# Patient Record
Sex: Female | Born: 1955
Health system: Southern US, Community
[De-identification: ages and names within clinical notes are randomized; demographics above are authoritative.]

## PROBLEM LIST (undated history)

## (undated) DIAGNOSIS — E039 Hypothyroidism, unspecified: Secondary | ICD-10-CM

## (undated) DIAGNOSIS — M65979 Unspecified synovitis and tenosynovitis, unspecified ankle and foot: Secondary | ICD-10-CM

## (undated) DIAGNOSIS — C50919 Malignant neoplasm of unspecified site of unspecified female breast: Secondary | ICD-10-CM

## (undated) DIAGNOSIS — L0390 Cellulitis, unspecified: Secondary | ICD-10-CM

## (undated) DIAGNOSIS — M722 Plantar fascial fibromatosis: Secondary | ICD-10-CM

## (undated) DIAGNOSIS — C50111 Malignant neoplasm of central portion of right female breast: Secondary | ICD-10-CM

## (undated) DIAGNOSIS — M659 Synovitis and tenosynovitis, unspecified: Secondary | ICD-10-CM

## (undated) DIAGNOSIS — T82598A Other mechanical complication of other cardiac and vascular devices and implants, initial encounter: Secondary | ICD-10-CM

## (undated) HISTORY — DX: Hypothyroidism, unspecified: E03.9

## (undated) HISTORY — DX: Synovitis and tenosynovitis, unspecified: M65.9

## (undated) HISTORY — PX: MASTECTOMY: SHX3

## (undated) HISTORY — DX: Unspecified synovitis and tenosynovitis, unspecified ankle and foot: M65.979

## (undated) HISTORY — DX: Other mechanical complication of other cardiac and vascular devices and implants, initial encounter: T82.598A

## (undated) HISTORY — PX: ABDOMINAL HYSTERECTOMY: SHX81

## (undated) HISTORY — PX: OTHER SURGICAL HISTORY: SHX169

## (undated) HISTORY — DX: Malignant neoplasm of unspecified site of unspecified female breast: C50.919

## (undated) HISTORY — DX: Plantar fascial fibromatosis: M72.2

## (undated) HISTORY — DX: Cellulitis, unspecified: L03.90

## (undated) HISTORY — DX: Malignant neoplasm of central portion of right female breast: C50.111

---

## 2004-03-01 ENCOUNTER — Ambulatory Visit: Payer: Self-pay | Admitting: Oncology

## 2005-01-09 ENCOUNTER — Ambulatory Visit: Payer: Self-pay | Admitting: Oncology

## 2005-03-05 ENCOUNTER — Ambulatory Visit: Payer: Self-pay | Admitting: Oncology

## 2006-04-01 ENCOUNTER — Ambulatory Visit: Payer: Self-pay | Admitting: Oncology

## 2006-04-23 ENCOUNTER — Encounter: Admission: RE | Admit: 2006-04-23 | Discharge: 2006-04-23 | Payer: Self-pay | Admitting: Oncology

## 2006-06-01 ENCOUNTER — Encounter (INDEPENDENT_AMBULATORY_CARE_PROVIDER_SITE_OTHER): Payer: Self-pay | Admitting: *Deleted

## 2006-06-01 ENCOUNTER — Encounter: Admission: RE | Admit: 2006-06-01 | Discharge: 2006-06-01 | Payer: Self-pay | Admitting: Oncology

## 2006-06-01 ENCOUNTER — Encounter (INDEPENDENT_AMBULATORY_CARE_PROVIDER_SITE_OTHER): Payer: Self-pay | Admitting: Diagnostic Radiology

## 2006-11-02 ENCOUNTER — Ambulatory Visit: Payer: Self-pay | Admitting: Oncology

## 2010-06-11 HISTORY — PX: PORT-A-CATH REMOVAL: SHX5289

## 2010-07-26 ENCOUNTER — Encounter: Payer: Self-pay | Admitting: Cardiothoracic Surgery

## 2010-07-26 ENCOUNTER — Ambulatory Visit
Admission: RE | Admit: 2010-07-26 | Discharge: 2010-07-26 | Disposition: A | Discharge status: Home / Self Care | Payer: PRIVATE HEALTH INSURANCE | Source: Ambulatory Visit | Attending: Surgery | Admitting: Surgery

## 2010-07-26 ENCOUNTER — Encounter (INDEPENDENT_AMBULATORY_CARE_PROVIDER_SITE_OTHER): Payer: PRIVATE HEALTH INSURANCE | Admitting: Cardiothoracic Surgery

## 2010-07-26 ENCOUNTER — Other Ambulatory Visit: Payer: Self-pay | Admitting: Surgery

## 2010-07-26 DIAGNOSIS — T82598A Other mechanical complication of other cardiac and vascular devices and implants, initial encounter: Secondary | ICD-10-CM

## 2010-07-27 NOTE — Consult Note (Signed)
NEW PATIENT CONSULTATION  [name], [name] DOB:  Oct 09, 1955                                        July 26, 2010 CHART #:  [id number]  REASON FOR CONSULTATION:  Retained Port-A-Cath segment and right atrium.  PRESENT ILLNESS:  [name] is a 55 year old Caucasian female nonsmoker with a history of bilateral breast cancer who had a Port-A-Cath placed 3- 4 years ago for chemotherapy, and it was removed by Dr. Earney Navy on Jun 21, 2010, at [facility].  The entire catheter did not come out, and she was sent directly from the operating room to Interventional Radiology where Dr. Bonnielee Haff attempted a femoral vein approach to snare the catheter and removed it, which was unsuccessful.  This was done at [facility] and fluoroscopy time was just under 14 minutes.  A chest x-ray taken today shows the Port-A-Cath catheter's fragment to be in the right atrium just proximal to the tricuspid valve.  There have been no symptoms of palpitation or shortness of breath.  The patient is not anticoagulated and is in a sinus rhythm now.  Because of the retained catheter segment, it was unable to be removed by IR.  A surgical evaluation was requested.  PAST MEDICAL HISTORY: 1. History of bilateral invasive ductal adenocarcinoma of the breast,     status post radiation therapy to the chest 12 years ago and status     post right mastectomy approximately 4 years ago.  She also had a     left lumpectomy in the past for cancer. 2. Hypothyroidism. 3. Status post hysterectomy.  HOME MEDICATIONS:  Fosamax, Synthroid, and Arimidex.  ALLERGIES:  PENICILLIN and LATEX.  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  She is a nonsmoker and drinks occasionally.  FAMILY HISTORY:  Noncontributory.  REVIEW OF SYSTEMS:  GENERAL:  No systemic problems of fever or weight loss or chest pain.  No previous thoracic surgery other than the  Port-A- Cath and mastectomy and lumpectomy.  No thoracic injuries.  CARDIAC: Review is negative for history of angina, CHF, cardiac murmur, or arrhythmia.  GI:  Review is negative for hepatitis or jaundice or blood per rectum.  ENDOCRINE:  Review is negative for diabetes.  VASCULAR: Review is negative for DVT.  Her mother did have pulmonary embolus from DVT 40 years ago.  NEUROLOGIC:  Review is negative for stroke or seizure.  HEMATOLOGIC:  Review is significant for breast cancer, which is probably in remission.  Her nodal status from her most recent mastectomy was negative.  PHYSICAL EXAMINATION:  VITAL SIGNS:  Blood pressure 130/80, pulse 60 and regular, respirations 18, saturation 99%.  She weighs in 183 and she is 5 feet 6 inches. GENERAL:  She is an alert and pleasant 55 year old Caucasian female. HEENT:  Normocephalic. NECK:  Without JVD or mass or adenopathy. LUNGS:  Breath sounds are clear and equal.  She has tattoo marks on her anterior chest extending from the right lateral clavicular line to the sternum with a mastectomy well-healed scar. CARDIAC:  Rhythm is regular without S3 gallop or murmur. ABDOMEN:  Soft and nontender without mass. EXTREMITIES:  No clubbing, cyanosis, or edema.  Peripheral pulses were intact.  There is no peripheral edema. NEUROLOGIC:  Normal.  LABORATORY DATA:  Recent chest x-ray shows the retained segment of  a Port-A-Cath in her right atrium extending up to the tricuspid valve.  IMPRESSION/RECOMMENDATIONS:  I doubt the catheter would be at risk for migration in the lungs since aggressive attempt at interventional radiology.  Removal was unsuccessful in pulling out the catheter.  I am concerned about long-term effects of thromboembolic complications from thrombus forming on the catheter and then migrating into the lungs. Over the long-term, I think she would be better off of the catheter removed.  However, before sternotomy, an open heart surgery  and cardiopulmonary bypass, I would recommend another attempted interventional radiology here in [place].  We will contact the IR staff here to consider that as the initial best option.  We will contact the patient regarding this in the near future sign.  Kerin Perna, M.D. Electronically Signed  PV/MEDQ  D:  07/26/2010  T:  07/27/2010  Job:  [id number]  cc:   [employer]

## 2010-08-15 ENCOUNTER — Other Ambulatory Visit: Payer: Self-pay | Admitting: Cardiothoracic Surgery

## 2010-08-15 DIAGNOSIS — T82598A Other mechanical complication of other cardiac and vascular devices and implants, initial encounter: Secondary | ICD-10-CM

## 2010-08-16 ENCOUNTER — Other Ambulatory Visit: Payer: Self-pay | Admitting: Cardiothoracic Surgery

## 2010-08-16 ENCOUNTER — Ambulatory Visit
Admission: RE | Admit: 2010-08-16 | Discharge: 2010-08-16 | Disposition: A | Discharge status: Home / Self Care | Payer: PRIVATE HEALTH INSURANCE | Source: Ambulatory Visit | Attending: Cardiothoracic Surgery | Admitting: Cardiothoracic Surgery

## 2010-08-16 ENCOUNTER — Ambulatory Visit (INDEPENDENT_AMBULATORY_CARE_PROVIDER_SITE_OTHER): Payer: PRIVATE HEALTH INSURANCE | Admitting: Cardiothoracic Surgery

## 2010-08-16 DIAGNOSIS — T82598A Other mechanical complication of other cardiac and vascular devices and implants, initial encounter: Secondary | ICD-10-CM

## 2010-08-16 DIAGNOSIS — IMO0002 Reserved for concepts with insufficient information to code with codable children: Secondary | ICD-10-CM

## 2010-08-17 NOTE — Assessment & Plan Note (Signed)
OFFICE VISIT  Stacy Park, Stacy Park DOB:  10-26-1955                                        August 16, 2010 CHART #:  [id number]  CURRENT PROBLEMS: 1. Retained 6-cm segment of Port-A-Cath in the right ventricle noted     following Port-A-Cath removal, May 2012. 2. History of breast cancer, status post chemoradiation. 3. Jehovah's [employer]es.  PRESENT ILLNESS:  The patient is a very nice 55 year old female who returns for further followup discussion of a retained segment of a Port- A-Cath in her right ventricle.  This has been asymptomatic.  She has no arrhythmias or symptoms.  An attempt by Interventional Radiology to extract the catheter in May of this year on site at [facility] was unsuccessful.  She is back to work full-time.  She was seen approximately 2 weeks ago for this problem and I recommended a second attempt by Interventional Radiology in the [place] facility to try to extract the catheter segment and after discussing this with Interventional Radiology hear, no further appointment or evaluation was scheduled.  The patient remains asymptomatic.  I have discussed this retained segment of cath with Dr. Fredia Sorrow and the Interventional Radiology at [facility] and he has agreed to arrange for the patient to present here for attempted extraction via a femoral vein puncture on August 20, 2010.  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  I told the patient if the second attempt of extraction percutaneously unsuccessful, she will be probably need a sternotomy and cardiopulmonary bypass in opening of the right atrium.  PHYSICAL EXAMINATION:  Today, her blood pressure 120/80, pulse is 60 and regular, saturation on room air 99%.  The left infraclavicular Port-A- Cath incision is well healed.  Breath sounds are clear and the cardiac rhythm is regular without murmur or rub.  PLAN:  The patient will have a second attempt at  Interventional Radiology extraction of the Port-A-Cath segment.  If this is not successful, she will return for discussion of a possible sternotomy and cardiopulmonary bypass.  In that case, we would need to build her hemoglobin up over 12 grams as she would not accept the blood transfusion therapy as needed.  Kerin Perna, M.D. Electronically Signed  PV/MEDQ  D:  08/16/2010  T:  08/17/2010  Job:  [id number]  cc:   Dellia Beckwith, M.D.

## 2010-08-20 ENCOUNTER — Ambulatory Visit (HOSPITAL_COMMUNITY)
Admission: RE | Admit: 2010-08-20 | Discharge: 2010-08-20 | Disposition: A | Discharge status: Home / Self Care | Payer: PRIVATE HEALTH INSURANCE | Source: Ambulatory Visit | Attending: Cardiothoracic Surgery | Admitting: Cardiothoracic Surgery

## 2010-08-20 ENCOUNTER — Other Ambulatory Visit: Payer: Self-pay | Admitting: Cardiothoracic Surgery

## 2010-08-20 DIAGNOSIS — Z853 Personal history of malignant neoplasm of breast: Secondary | ICD-10-CM | POA: Insufficient documentation

## 2010-08-20 DIAGNOSIS — Y9210 Unspecified residential institution as the place of occurrence of the external cause: Secondary | ICD-10-CM | POA: Insufficient documentation

## 2010-08-20 DIAGNOSIS — T81507A Unspecified complication of foreign body accidentally left in body following removal of catheter or packing, initial encounter: Secondary | ICD-10-CM | POA: Insufficient documentation

## 2010-08-20 DIAGNOSIS — IMO0002 Reserved for concepts with insufficient information to code with codable children: Secondary | ICD-10-CM

## 2010-08-20 DIAGNOSIS — T81509A Unspecified complication of foreign body accidentally left in body following unspecified procedure, initial encounter: Secondary | ICD-10-CM | POA: Insufficient documentation

## 2010-08-20 MED ORDER — IODIXANOL 320 MG/ML IV SOLN
100.0000 mL | Freq: Once | INTRAVENOUS | Status: AC | PRN
  Administered 2010-08-20: 20 mL via INTRAVENOUS

## 2010-09-30 DIAGNOSIS — M722 Plantar fascial fibromatosis: Secondary | ICD-10-CM

## 2010-09-30 DIAGNOSIS — M659 Synovitis and tenosynovitis, unspecified: Secondary | ICD-10-CM

## 2010-09-30 DIAGNOSIS — L0390 Cellulitis, unspecified: Secondary | ICD-10-CM

## 2010-09-30 DIAGNOSIS — E039 Hypothyroidism, unspecified: Secondary | ICD-10-CM

## 2010-09-30 DIAGNOSIS — T82598A Other mechanical complication of other cardiac and vascular devices and implants, initial encounter: Secondary | ICD-10-CM

## 2010-09-30 DIAGNOSIS — C50919 Malignant neoplasm of unspecified site of unspecified female breast: Secondary | ICD-10-CM

## 2010-10-02 ENCOUNTER — Encounter: Payer: PRIVATE HEALTH INSURANCE | Admitting: Cardiothoracic Surgery

## 2015-09-07 DIAGNOSIS — J40 Bronchitis, not specified as acute or chronic: Secondary | ICD-10-CM | POA: Diagnosis not present

## 2015-12-14 DIAGNOSIS — Z1389 Encounter for screening for other disorder: Secondary | ICD-10-CM | POA: Diagnosis not present

## 2015-12-14 DIAGNOSIS — Z0000 Encounter for general adult medical examination without abnormal findings: Secondary | ICD-10-CM | POA: Diagnosis not present

## 2016-06-27 DIAGNOSIS — Z6831 Body mass index (BMI) 31.0-31.9, adult: Secondary | ICD-10-CM | POA: Diagnosis not present

## 2016-06-27 DIAGNOSIS — L03213 Periorbital cellulitis: Secondary | ICD-10-CM | POA: Diagnosis not present

## 2016-07-10 DIAGNOSIS — L739 Follicular disorder, unspecified: Secondary | ICD-10-CM | POA: Diagnosis not present

## 2016-07-18 DIAGNOSIS — R21 Rash and other nonspecific skin eruption: Secondary | ICD-10-CM | POA: Diagnosis not present

## 2016-07-18 DIAGNOSIS — W57XXXA Bitten or stung by nonvenomous insect and other nonvenomous arthropods, initial encounter: Secondary | ICD-10-CM | POA: Diagnosis not present

## 2016-08-22 DIAGNOSIS — Z1231 Encounter for screening mammogram for malignant neoplasm of breast: Secondary | ICD-10-CM | POA: Diagnosis not present

## 2016-08-22 DIAGNOSIS — Z853 Personal history of malignant neoplasm of breast: Secondary | ICD-10-CM | POA: Diagnosis not present

## 2016-08-27 DIAGNOSIS — Z853 Personal history of malignant neoplasm of breast: Secondary | ICD-10-CM | POA: Diagnosis not present

## 2016-08-27 DIAGNOSIS — C50111 Malignant neoplasm of central portion of right female breast: Secondary | ICD-10-CM | POA: Diagnosis not present

## 2016-09-14 DIAGNOSIS — H00016 Hordeolum externum left eye, unspecified eyelid: Secondary | ICD-10-CM | POA: Diagnosis not present

## 2016-09-24 DIAGNOSIS — H109 Unspecified conjunctivitis: Secondary | ICD-10-CM | POA: Diagnosis not present

## 2016-09-24 DIAGNOSIS — Z6831 Body mass index (BMI) 31.0-31.9, adult: Secondary | ICD-10-CM | POA: Diagnosis not present

## 2016-10-27 DIAGNOSIS — J40 Bronchitis, not specified as acute or chronic: Secondary | ICD-10-CM | POA: Diagnosis not present

## 2016-10-27 DIAGNOSIS — Z6831 Body mass index (BMI) 31.0-31.9, adult: Secondary | ICD-10-CM | POA: Diagnosis not present

## 2016-11-05 DIAGNOSIS — Z6831 Body mass index (BMI) 31.0-31.9, adult: Secondary | ICD-10-CM | POA: Diagnosis not present

## 2016-11-05 DIAGNOSIS — J40 Bronchitis, not specified as acute or chronic: Secondary | ICD-10-CM | POA: Diagnosis not present

## 2016-11-13 DIAGNOSIS — Z6831 Body mass index (BMI) 31.0-31.9, adult: Secondary | ICD-10-CM | POA: Diagnosis not present

## 2016-11-13 DIAGNOSIS — R05 Cough: Secondary | ICD-10-CM | POA: Diagnosis not present

## 2016-11-13 DIAGNOSIS — J40 Bronchitis, not specified as acute or chronic: Secondary | ICD-10-CM | POA: Diagnosis not present

## 2016-12-25 DIAGNOSIS — Z23 Encounter for immunization: Secondary | ICD-10-CM | POA: Diagnosis not present

## 2016-12-25 DIAGNOSIS — Z1331 Encounter for screening for depression: Secondary | ICD-10-CM | POA: Diagnosis not present

## 2016-12-25 DIAGNOSIS — Z0000 Encounter for general adult medical examination without abnormal findings: Secondary | ICD-10-CM | POA: Diagnosis not present

## 2016-12-25 DIAGNOSIS — Z6832 Body mass index (BMI) 32.0-32.9, adult: Secondary | ICD-10-CM | POA: Diagnosis not present

## 2016-12-25 DIAGNOSIS — E039 Hypothyroidism, unspecified: Secondary | ICD-10-CM | POA: Diagnosis not present

## 2016-12-26 DIAGNOSIS — Z79899 Other long term (current) drug therapy: Secondary | ICD-10-CM | POA: Diagnosis not present

## 2016-12-26 DIAGNOSIS — E039 Hypothyroidism, unspecified: Secondary | ICD-10-CM | POA: Diagnosis not present

## 2017-01-08 DIAGNOSIS — N179 Acute kidney failure, unspecified: Secondary | ICD-10-CM | POA: Diagnosis not present

## 2017-04-07 DIAGNOSIS — Z6832 Body mass index (BMI) 32.0-32.9, adult: Secondary | ICD-10-CM | POA: Diagnosis not present

## 2017-04-07 DIAGNOSIS — J0120 Acute ethmoidal sinusitis, unspecified: Secondary | ICD-10-CM | POA: Diagnosis not present

## 2017-06-22 DIAGNOSIS — J329 Chronic sinusitis, unspecified: Secondary | ICD-10-CM | POA: Diagnosis not present

## 2017-06-22 DIAGNOSIS — Z6833 Body mass index (BMI) 33.0-33.9, adult: Secondary | ICD-10-CM | POA: Diagnosis not present

## 2017-08-24 DIAGNOSIS — Z1231 Encounter for screening mammogram for malignant neoplasm of breast: Secondary | ICD-10-CM | POA: Diagnosis not present

## 2017-08-27 DIAGNOSIS — Z79811 Long term (current) use of aromatase inhibitors: Secondary | ICD-10-CM | POA: Diagnosis not present

## 2017-08-27 DIAGNOSIS — Z853 Personal history of malignant neoplasm of breast: Secondary | ICD-10-CM | POA: Diagnosis not present

## 2017-09-03 DIAGNOSIS — Z6832 Body mass index (BMI) 32.0-32.9, adult: Secondary | ICD-10-CM | POA: Diagnosis not present

## 2017-09-03 DIAGNOSIS — F4322 Adjustment disorder with anxiety: Secondary | ICD-10-CM | POA: Diagnosis not present

## 2017-09-03 DIAGNOSIS — Z1339 Encounter for screening examination for other mental health and behavioral disorders: Secondary | ICD-10-CM | POA: Diagnosis not present

## 2017-10-06 DIAGNOSIS — J329 Chronic sinusitis, unspecified: Secondary | ICD-10-CM | POA: Diagnosis not present

## 2017-10-06 DIAGNOSIS — Z6832 Body mass index (BMI) 32.0-32.9, adult: Secondary | ICD-10-CM | POA: Diagnosis not present

## 2017-10-06 DIAGNOSIS — J40 Bronchitis, not specified as acute or chronic: Secondary | ICD-10-CM | POA: Diagnosis not present

## 2018-01-01 DIAGNOSIS — Z13220 Encounter for screening for lipoid disorders: Secondary | ICD-10-CM | POA: Diagnosis not present

## 2018-01-01 DIAGNOSIS — Z6832 Body mass index (BMI) 32.0-32.9, adult: Secondary | ICD-10-CM | POA: Diagnosis not present

## 2018-01-01 DIAGNOSIS — Z23 Encounter for immunization: Secondary | ICD-10-CM | POA: Diagnosis not present

## 2018-01-01 DIAGNOSIS — Z0000 Encounter for general adult medical examination without abnormal findings: Secondary | ICD-10-CM | POA: Diagnosis not present

## 2018-01-01 DIAGNOSIS — Z1331 Encounter for screening for depression: Secondary | ICD-10-CM | POA: Diagnosis not present

## 2018-01-26 DIAGNOSIS — R221 Localized swelling, mass and lump, neck: Secondary | ICD-10-CM | POA: Diagnosis not present

## 2018-01-26 DIAGNOSIS — Z6832 Body mass index (BMI) 32.0-32.9, adult: Secondary | ICD-10-CM | POA: Diagnosis not present

## 2018-02-01 DIAGNOSIS — E041 Nontoxic single thyroid nodule: Secondary | ICD-10-CM | POA: Diagnosis not present

## 2018-02-01 DIAGNOSIS — R221 Localized swelling, mass and lump, neck: Secondary | ICD-10-CM | POA: Diagnosis not present

## 2018-02-08 DIAGNOSIS — R0989 Other specified symptoms and signs involving the circulatory and respiratory systems: Secondary | ICD-10-CM | POA: Diagnosis not present

## 2018-02-08 DIAGNOSIS — Z853 Personal history of malignant neoplasm of breast: Secondary | ICD-10-CM | POA: Diagnosis not present

## 2018-02-08 DIAGNOSIS — R221 Localized swelling, mass and lump, neck: Secondary | ICD-10-CM | POA: Diagnosis not present

## 2018-02-08 DIAGNOSIS — E041 Nontoxic single thyroid nodule: Secondary | ICD-10-CM | POA: Diagnosis not present

## 2018-03-02 DIAGNOSIS — J029 Acute pharyngitis, unspecified: Secondary | ICD-10-CM | POA: Diagnosis not present

## 2018-03-02 DIAGNOSIS — E042 Nontoxic multinodular goiter: Secondary | ICD-10-CM | POA: Diagnosis not present

## 2018-03-02 DIAGNOSIS — Z853 Personal history of malignant neoplasm of breast: Secondary | ICD-10-CM | POA: Diagnosis not present

## 2018-03-02 DIAGNOSIS — E039 Hypothyroidism, unspecified: Secondary | ICD-10-CM | POA: Diagnosis not present

## 2018-03-02 DIAGNOSIS — R221 Localized swelling, mass and lump, neck: Secondary | ICD-10-CM | POA: Diagnosis not present

## 2018-03-02 DIAGNOSIS — Z79811 Long term (current) use of aromatase inhibitors: Secondary | ICD-10-CM | POA: Diagnosis not present

## 2018-03-03 DIAGNOSIS — C50912 Malignant neoplasm of unspecified site of left female breast: Secondary | ICD-10-CM | POA: Diagnosis not present

## 2018-03-03 DIAGNOSIS — C50111 Malignant neoplasm of central portion of right female breast: Secondary | ICD-10-CM | POA: Diagnosis not present

## 2018-03-03 DIAGNOSIS — C50911 Malignant neoplasm of unspecified site of right female breast: Secondary | ICD-10-CM | POA: Diagnosis not present

## 2018-03-03 DIAGNOSIS — Z853 Personal history of malignant neoplasm of breast: Secondary | ICD-10-CM | POA: Diagnosis not present

## 2018-03-03 DIAGNOSIS — R221 Localized swelling, mass and lump, neck: Secondary | ICD-10-CM | POA: Diagnosis not present

## 2018-03-03 DIAGNOSIS — R591 Generalized enlarged lymph nodes: Secondary | ICD-10-CM | POA: Diagnosis not present

## 2018-03-08 DIAGNOSIS — R9389 Abnormal findings on diagnostic imaging of other specified body structures: Secondary | ICD-10-CM | POA: Diagnosis not present

## 2018-03-08 DIAGNOSIS — R918 Other nonspecific abnormal finding of lung field: Secondary | ICD-10-CM | POA: Diagnosis not present

## 2018-03-08 DIAGNOSIS — C50111 Malignant neoplasm of central portion of right female breast: Secondary | ICD-10-CM | POA: Diagnosis not present

## 2018-03-15 ENCOUNTER — Encounter: Payer: Self-pay | Admitting: Oncology

## 2018-03-15 DIAGNOSIS — C50111 Malignant neoplasm of central portion of right female breast: Secondary | ICD-10-CM | POA: Diagnosis not present

## 2018-03-15 DIAGNOSIS — C770 Secondary and unspecified malignant neoplasm of lymph nodes of head, face and neck: Secondary | ICD-10-CM | POA: Diagnosis not present

## 2018-03-15 DIAGNOSIS — C801 Malignant (primary) neoplasm, unspecified: Secondary | ICD-10-CM | POA: Diagnosis not present

## 2018-03-15 DIAGNOSIS — R590 Localized enlarged lymph nodes: Secondary | ICD-10-CM | POA: Diagnosis not present

## 2018-03-15 DIAGNOSIS — R221 Localized swelling, mass and lump, neck: Secondary | ICD-10-CM | POA: Diagnosis not present

## 2018-03-18 DIAGNOSIS — Z8042 Family history of malignant neoplasm of prostate: Secondary | ICD-10-CM | POA: Diagnosis not present

## 2018-03-18 DIAGNOSIS — C7951 Secondary malignant neoplasm of bone: Secondary | ICD-10-CM | POA: Diagnosis not present

## 2018-03-18 DIAGNOSIS — C779 Secondary and unspecified malignant neoplasm of lymph node, unspecified: Secondary | ICD-10-CM | POA: Diagnosis not present

## 2018-03-18 DIAGNOSIS — Z803 Family history of malignant neoplasm of breast: Secondary | ICD-10-CM | POA: Diagnosis not present

## 2018-03-18 DIAGNOSIS — C778 Secondary and unspecified malignant neoplasm of lymph nodes of multiple regions: Secondary | ICD-10-CM | POA: Diagnosis not present

## 2018-03-18 DIAGNOSIS — Z853 Personal history of malignant neoplasm of breast: Secondary | ICD-10-CM | POA: Diagnosis not present

## 2018-03-18 DIAGNOSIS — C50919 Malignant neoplasm of unspecified site of unspecified female breast: Secondary | ICD-10-CM | POA: Diagnosis not present

## 2018-03-18 DIAGNOSIS — C50111 Malignant neoplasm of central portion of right female breast: Secondary | ICD-10-CM | POA: Diagnosis not present

## 2018-03-22 DIAGNOSIS — M8589 Other specified disorders of bone density and structure, multiple sites: Secondary | ICD-10-CM | POA: Diagnosis not present

## 2018-03-22 DIAGNOSIS — C50111 Malignant neoplasm of central portion of right female breast: Secondary | ICD-10-CM | POA: Diagnosis not present

## 2018-03-22 DIAGNOSIS — Z13820 Encounter for screening for osteoporosis: Secondary | ICD-10-CM | POA: Diagnosis not present

## 2018-03-22 DIAGNOSIS — C50919 Malignant neoplasm of unspecified site of unspecified female breast: Secondary | ICD-10-CM | POA: Diagnosis not present

## 2018-03-22 DIAGNOSIS — C7951 Secondary malignant neoplasm of bone: Secondary | ICD-10-CM | POA: Diagnosis not present

## 2018-03-23 DIAGNOSIS — C7951 Secondary malignant neoplasm of bone: Secondary | ICD-10-CM | POA: Diagnosis not present

## 2018-03-23 DIAGNOSIS — C50111 Malignant neoplasm of central portion of right female breast: Secondary | ICD-10-CM | POA: Diagnosis not present

## 2018-03-31 DIAGNOSIS — C778 Secondary and unspecified malignant neoplasm of lymph nodes of multiple regions: Secondary | ICD-10-CM | POA: Diagnosis not present

## 2018-03-31 DIAGNOSIS — C50111 Malignant neoplasm of central portion of right female breast: Secondary | ICD-10-CM | POA: Diagnosis not present

## 2018-03-31 DIAGNOSIS — C7951 Secondary malignant neoplasm of bone: Secondary | ICD-10-CM | POA: Diagnosis not present

## 2018-04-14 DIAGNOSIS — C778 Secondary and unspecified malignant neoplasm of lymph nodes of multiple regions: Secondary | ICD-10-CM | POA: Diagnosis not present

## 2018-04-14 DIAGNOSIS — C50111 Malignant neoplasm of central portion of right female breast: Secondary | ICD-10-CM | POA: Diagnosis not present

## 2018-04-14 DIAGNOSIS — C7951 Secondary malignant neoplasm of bone: Secondary | ICD-10-CM | POA: Diagnosis not present

## 2018-04-28 DIAGNOSIS — C778 Secondary and unspecified malignant neoplasm of lymph nodes of multiple regions: Secondary | ICD-10-CM | POA: Diagnosis not present

## 2018-04-28 DIAGNOSIS — C779 Secondary and unspecified malignant neoplasm of lymph node, unspecified: Secondary | ICD-10-CM

## 2018-04-28 DIAGNOSIS — Z853 Personal history of malignant neoplasm of breast: Secondary | ICD-10-CM

## 2018-04-28 DIAGNOSIS — C7951 Secondary malignant neoplasm of bone: Secondary | ICD-10-CM | POA: Diagnosis not present

## 2018-04-28 DIAGNOSIS — Z515 Encounter for palliative care: Secondary | ICD-10-CM | POA: Diagnosis not present

## 2018-04-28 DIAGNOSIS — C50111 Malignant neoplasm of central portion of right female breast: Secondary | ICD-10-CM | POA: Diagnosis not present

## 2018-05-05 DIAGNOSIS — C771 Secondary and unspecified malignant neoplasm of intrathoracic lymph nodes: Secondary | ICD-10-CM | POA: Diagnosis not present

## 2018-05-05 DIAGNOSIS — C7951 Secondary malignant neoplasm of bone: Secondary | ICD-10-CM | POA: Diagnosis not present

## 2018-05-05 DIAGNOSIS — C50111 Malignant neoplasm of central portion of right female breast: Secondary | ICD-10-CM | POA: Diagnosis not present

## 2018-05-05 DIAGNOSIS — Z79811 Long term (current) use of aromatase inhibitors: Secondary | ICD-10-CM | POA: Diagnosis not present

## 2018-05-19 DIAGNOSIS — C50111 Malignant neoplasm of central portion of right female breast: Secondary | ICD-10-CM | POA: Diagnosis not present

## 2018-05-31 DIAGNOSIS — Z853 Personal history of malignant neoplasm of breast: Secondary | ICD-10-CM | POA: Diagnosis not present

## 2018-05-31 DIAGNOSIS — C779 Secondary and unspecified malignant neoplasm of lymph node, unspecified: Secondary | ICD-10-CM | POA: Diagnosis not present

## 2018-05-31 DIAGNOSIS — R109 Unspecified abdominal pain: Secondary | ICD-10-CM | POA: Diagnosis not present

## 2018-05-31 DIAGNOSIS — C7951 Secondary malignant neoplasm of bone: Secondary | ICD-10-CM | POA: Diagnosis not present

## 2018-05-31 DIAGNOSIS — C50111 Malignant neoplasm of central portion of right female breast: Secondary | ICD-10-CM | POA: Diagnosis not present

## 2018-05-31 DIAGNOSIS — Z79810 Long term (current) use of selective estrogen receptor modulators (SERMs): Secondary | ICD-10-CM | POA: Diagnosis not present

## 2018-05-31 DIAGNOSIS — R1084 Generalized abdominal pain: Secondary | ICD-10-CM | POA: Diagnosis not present

## 2018-06-03 DIAGNOSIS — R590 Localized enlarged lymph nodes: Secondary | ICD-10-CM | POA: Diagnosis not present

## 2018-06-03 DIAGNOSIS — Z853 Personal history of malignant neoplasm of breast: Secondary | ICD-10-CM | POA: Diagnosis not present

## 2018-06-03 DIAGNOSIS — C50111 Malignant neoplasm of central portion of right female breast: Secondary | ICD-10-CM | POA: Diagnosis not present

## 2018-06-03 DIAGNOSIS — R918 Other nonspecific abnormal finding of lung field: Secondary | ICD-10-CM | POA: Diagnosis not present

## 2018-06-03 DIAGNOSIS — R1084 Generalized abdominal pain: Secondary | ICD-10-CM | POA: Diagnosis not present

## 2018-06-03 DIAGNOSIS — R935 Abnormal findings on diagnostic imaging of other abdominal regions, including retroperitoneum: Secondary | ICD-10-CM | POA: Diagnosis not present

## 2018-06-03 DIAGNOSIS — K6389 Other specified diseases of intestine: Secondary | ICD-10-CM | POA: Diagnosis not present

## 2018-06-03 DIAGNOSIS — R110 Nausea: Secondary | ICD-10-CM | POA: Diagnosis not present

## 2018-06-04 DIAGNOSIS — C50111 Malignant neoplasm of central portion of right female breast: Secondary | ICD-10-CM | POA: Diagnosis not present

## 2018-06-09 DIAGNOSIS — C7951 Secondary malignant neoplasm of bone: Secondary | ICD-10-CM | POA: Diagnosis not present

## 2018-06-09 DIAGNOSIS — R748 Abnormal levels of other serum enzymes: Secondary | ICD-10-CM | POA: Diagnosis not present

## 2018-06-09 DIAGNOSIS — D708 Other neutropenia: Secondary | ICD-10-CM | POA: Diagnosis not present

## 2018-06-09 DIAGNOSIS — C50111 Malignant neoplasm of central portion of right female breast: Secondary | ICD-10-CM | POA: Diagnosis not present

## 2018-06-09 DIAGNOSIS — E876 Hypokalemia: Secondary | ICD-10-CM | POA: Diagnosis not present

## 2018-06-09 DIAGNOSIS — C779 Secondary and unspecified malignant neoplasm of lymph node, unspecified: Secondary | ICD-10-CM | POA: Diagnosis not present

## 2018-06-10 DIAGNOSIS — R1011 Right upper quadrant pain: Secondary | ICD-10-CM | POA: Diagnosis not present

## 2018-06-10 DIAGNOSIS — C50111 Malignant neoplasm of central portion of right female breast: Secondary | ICD-10-CM | POA: Diagnosis not present

## 2018-06-10 DIAGNOSIS — Z79899 Other long term (current) drug therapy: Secondary | ICD-10-CM | POA: Diagnosis not present

## 2018-06-16 DIAGNOSIS — C50111 Malignant neoplasm of central portion of right female breast: Secondary | ICD-10-CM | POA: Diagnosis not present

## 2018-06-16 DIAGNOSIS — E876 Hypokalemia: Secondary | ICD-10-CM | POA: Diagnosis not present

## 2018-06-21 DIAGNOSIS — K529 Noninfective gastroenteritis and colitis, unspecified: Secondary | ICD-10-CM | POA: Diagnosis not present

## 2018-06-30 DIAGNOSIS — C50111 Malignant neoplasm of central portion of right female breast: Secondary | ICD-10-CM | POA: Diagnosis not present

## 2018-06-30 DIAGNOSIS — D708 Other neutropenia: Secondary | ICD-10-CM | POA: Diagnosis not present

## 2018-06-30 DIAGNOSIS — C7951 Secondary malignant neoplasm of bone: Secondary | ICD-10-CM | POA: Diagnosis not present

## 2018-07-14 DIAGNOSIS — D708 Other neutropenia: Secondary | ICD-10-CM | POA: Diagnosis not present

## 2018-07-14 DIAGNOSIS — Z853 Personal history of malignant neoplasm of breast: Secondary | ICD-10-CM | POA: Diagnosis not present

## 2018-07-14 DIAGNOSIS — C779 Secondary and unspecified malignant neoplasm of lymph node, unspecified: Secondary | ICD-10-CM | POA: Diagnosis not present

## 2018-07-14 DIAGNOSIS — C7951 Secondary malignant neoplasm of bone: Secondary | ICD-10-CM | POA: Diagnosis not present

## 2018-07-14 DIAGNOSIS — C50111 Malignant neoplasm of central portion of right female breast: Secondary | ICD-10-CM | POA: Diagnosis not present

## 2018-07-14 DIAGNOSIS — C778 Secondary and unspecified malignant neoplasm of lymph nodes of multiple regions: Secondary | ICD-10-CM | POA: Diagnosis not present

## 2018-07-21 DIAGNOSIS — C778 Secondary and unspecified malignant neoplasm of lymph nodes of multiple regions: Secondary | ICD-10-CM | POA: Diagnosis not present

## 2018-07-21 DIAGNOSIS — C50111 Malignant neoplasm of central portion of right female breast: Secondary | ICD-10-CM | POA: Diagnosis not present

## 2018-07-21 DIAGNOSIS — C7951 Secondary malignant neoplasm of bone: Secondary | ICD-10-CM | POA: Diagnosis not present

## 2018-08-18 DIAGNOSIS — C778 Secondary and unspecified malignant neoplasm of lymph nodes of multiple regions: Secondary | ICD-10-CM | POA: Diagnosis not present

## 2018-08-18 DIAGNOSIS — C7951 Secondary malignant neoplasm of bone: Secondary | ICD-10-CM | POA: Diagnosis not present

## 2018-08-18 DIAGNOSIS — C50111 Malignant neoplasm of central portion of right female breast: Secondary | ICD-10-CM | POA: Diagnosis not present

## 2018-08-27 DIAGNOSIS — Z1231 Encounter for screening mammogram for malignant neoplasm of breast: Secondary | ICD-10-CM | POA: Diagnosis not present

## 2018-08-27 DIAGNOSIS — Z853 Personal history of malignant neoplasm of breast: Secondary | ICD-10-CM | POA: Diagnosis not present

## 2018-09-13 DIAGNOSIS — Z853 Personal history of malignant neoplasm of breast: Secondary | ICD-10-CM | POA: Diagnosis not present

## 2018-09-13 DIAGNOSIS — C7951 Secondary malignant neoplasm of bone: Secondary | ICD-10-CM | POA: Diagnosis not present

## 2018-09-13 DIAGNOSIS — C771 Secondary and unspecified malignant neoplasm of intrathoracic lymph nodes: Secondary | ICD-10-CM | POA: Diagnosis not present

## 2018-09-13 DIAGNOSIS — C50111 Malignant neoplasm of central portion of right female breast: Secondary | ICD-10-CM | POA: Diagnosis not present

## 2018-09-15 DIAGNOSIS — C778 Secondary and unspecified malignant neoplasm of lymph nodes of multiple regions: Secondary | ICD-10-CM | POA: Diagnosis not present

## 2018-09-15 DIAGNOSIS — D701 Agranulocytosis secondary to cancer chemotherapy: Secondary | ICD-10-CM | POA: Diagnosis not present

## 2018-09-15 DIAGNOSIS — C7951 Secondary malignant neoplasm of bone: Secondary | ICD-10-CM | POA: Diagnosis not present

## 2018-09-15 DIAGNOSIS — Z515 Encounter for palliative care: Secondary | ICD-10-CM | POA: Diagnosis not present

## 2018-09-15 DIAGNOSIS — C50111 Malignant neoplasm of central portion of right female breast: Secondary | ICD-10-CM | POA: Diagnosis not present

## 2018-09-15 DIAGNOSIS — R0989 Other specified symptoms and signs involving the circulatory and respiratory systems: Secondary | ICD-10-CM | POA: Diagnosis not present

## 2018-09-28 DIAGNOSIS — E876 Hypokalemia: Secondary | ICD-10-CM | POA: Diagnosis not present

## 2018-09-28 DIAGNOSIS — C7951 Secondary malignant neoplasm of bone: Secondary | ICD-10-CM | POA: Diagnosis not present

## 2018-09-28 DIAGNOSIS — D72819 Decreased white blood cell count, unspecified: Secondary | ICD-10-CM | POA: Diagnosis not present

## 2018-09-28 DIAGNOSIS — C778 Secondary and unspecified malignant neoplasm of lymph nodes of multiple regions: Secondary | ICD-10-CM | POA: Diagnosis not present

## 2018-09-28 DIAGNOSIS — C50111 Malignant neoplasm of central portion of right female breast: Secondary | ICD-10-CM | POA: Diagnosis not present

## 2018-10-13 DIAGNOSIS — C778 Secondary and unspecified malignant neoplasm of lymph nodes of multiple regions: Secondary | ICD-10-CM | POA: Diagnosis not present

## 2018-10-13 DIAGNOSIS — C50111 Malignant neoplasm of central portion of right female breast: Secondary | ICD-10-CM | POA: Diagnosis not present

## 2018-10-13 DIAGNOSIS — C7951 Secondary malignant neoplasm of bone: Secondary | ICD-10-CM | POA: Diagnosis not present

## 2018-10-27 DIAGNOSIS — C50111 Malignant neoplasm of central portion of right female breast: Secondary | ICD-10-CM | POA: Diagnosis not present

## 2018-10-27 DIAGNOSIS — C7951 Secondary malignant neoplasm of bone: Secondary | ICD-10-CM | POA: Diagnosis not present

## 2018-10-27 DIAGNOSIS — C778 Secondary and unspecified malignant neoplasm of lymph nodes of multiple regions: Secondary | ICD-10-CM | POA: Diagnosis not present

## 2018-11-03 DIAGNOSIS — C50111 Malignant neoplasm of central portion of right female breast: Secondary | ICD-10-CM | POA: Diagnosis not present

## 2018-11-03 DIAGNOSIS — M7989 Other specified soft tissue disorders: Secondary | ICD-10-CM | POA: Diagnosis not present

## 2018-11-03 DIAGNOSIS — M79604 Pain in right leg: Secondary | ICD-10-CM | POA: Diagnosis not present

## 2018-11-03 DIAGNOSIS — R600 Localized edema: Secondary | ICD-10-CM | POA: Diagnosis not present

## 2018-11-03 DIAGNOSIS — Z23 Encounter for immunization: Secondary | ICD-10-CM | POA: Diagnosis not present

## 2018-11-10 DIAGNOSIS — C7951 Secondary malignant neoplasm of bone: Secondary | ICD-10-CM | POA: Diagnosis not present

## 2018-11-10 DIAGNOSIS — C50111 Malignant neoplasm of central portion of right female breast: Secondary | ICD-10-CM | POA: Diagnosis not present

## 2018-11-10 DIAGNOSIS — C778 Secondary and unspecified malignant neoplasm of lymph nodes of multiple regions: Secondary | ICD-10-CM | POA: Diagnosis not present

## 2018-11-24 DIAGNOSIS — C50111 Malignant neoplasm of central portion of right female breast: Secondary | ICD-10-CM | POA: Diagnosis not present

## 2018-12-08 DIAGNOSIS — C50111 Malignant neoplasm of central portion of right female breast: Secondary | ICD-10-CM | POA: Diagnosis not present

## 2018-12-08 DIAGNOSIS — C779 Secondary and unspecified malignant neoplasm of lymph node, unspecified: Secondary | ICD-10-CM | POA: Diagnosis not present

## 2018-12-08 DIAGNOSIS — Z170 Estrogen receptor positive status [ER+]: Secondary | ICD-10-CM | POA: Diagnosis not present

## 2018-12-08 DIAGNOSIS — C7951 Secondary malignant neoplasm of bone: Secondary | ICD-10-CM | POA: Diagnosis not present

## 2019-01-03 DIAGNOSIS — C771 Secondary and unspecified malignant neoplasm of intrathoracic lymph nodes: Secondary | ICD-10-CM | POA: Diagnosis not present

## 2019-01-03 DIAGNOSIS — F4322 Adjustment disorder with anxiety: Secondary | ICD-10-CM | POA: Diagnosis not present

## 2019-01-03 DIAGNOSIS — Z13220 Encounter for screening for lipoid disorders: Secondary | ICD-10-CM | POA: Diagnosis not present

## 2019-01-03 DIAGNOSIS — H5710 Ocular pain, unspecified eye: Secondary | ICD-10-CM | POA: Diagnosis not present

## 2019-01-03 DIAGNOSIS — Z6827 Body mass index (BMI) 27.0-27.9, adult: Secondary | ICD-10-CM | POA: Diagnosis not present

## 2019-01-03 DIAGNOSIS — C7951 Secondary malignant neoplasm of bone: Secondary | ICD-10-CM | POA: Diagnosis not present

## 2019-01-03 DIAGNOSIS — C50919 Malignant neoplasm of unspecified site of unspecified female breast: Secondary | ICD-10-CM | POA: Diagnosis not present

## 2019-01-03 DIAGNOSIS — C3490 Malignant neoplasm of unspecified part of unspecified bronchus or lung: Secondary | ICD-10-CM | POA: Diagnosis not present

## 2019-01-03 DIAGNOSIS — E039 Hypothyroidism, unspecified: Secondary | ICD-10-CM | POA: Diagnosis not present

## 2019-01-03 DIAGNOSIS — Z0000 Encounter for general adult medical examination without abnormal findings: Secondary | ICD-10-CM | POA: Diagnosis not present

## 2019-01-03 DIAGNOSIS — C50111 Malignant neoplasm of central portion of right female breast: Secondary | ICD-10-CM | POA: Diagnosis not present

## 2019-01-04 DIAGNOSIS — E039 Hypothyroidism, unspecified: Secondary | ICD-10-CM | POA: Diagnosis not present

## 2019-01-05 DIAGNOSIS — C7951 Secondary malignant neoplasm of bone: Secondary | ICD-10-CM | POA: Diagnosis not present

## 2019-01-05 DIAGNOSIS — C779 Secondary and unspecified malignant neoplasm of lymph node, unspecified: Secondary | ICD-10-CM | POA: Diagnosis not present

## 2019-01-05 DIAGNOSIS — Z170 Estrogen receptor positive status [ER+]: Secondary | ICD-10-CM | POA: Diagnosis not present

## 2019-01-05 DIAGNOSIS — C50111 Malignant neoplasm of central portion of right female breast: Secondary | ICD-10-CM | POA: Diagnosis not present

## 2019-01-05 DIAGNOSIS — C778 Secondary and unspecified malignant neoplasm of lymph nodes of multiple regions: Secondary | ICD-10-CM | POA: Diagnosis not present

## 2019-02-09 DIAGNOSIS — C50111 Malignant neoplasm of central portion of right female breast: Secondary | ICD-10-CM | POA: Diagnosis not present

## 2019-02-09 DIAGNOSIS — C7951 Secondary malignant neoplasm of bone: Secondary | ICD-10-CM | POA: Diagnosis not present

## 2019-02-09 DIAGNOSIS — C778 Secondary and unspecified malignant neoplasm of lymph nodes of multiple regions: Secondary | ICD-10-CM | POA: Diagnosis not present

## 2019-03-09 DIAGNOSIS — C50111 Malignant neoplasm of central portion of right female breast: Secondary | ICD-10-CM | POA: Diagnosis not present

## 2019-03-09 DIAGNOSIS — C778 Secondary and unspecified malignant neoplasm of lymph nodes of multiple regions: Secondary | ICD-10-CM | POA: Diagnosis not present

## 2019-03-09 DIAGNOSIS — C7951 Secondary malignant neoplasm of bone: Secondary | ICD-10-CM | POA: Diagnosis not present

## 2019-03-16 DIAGNOSIS — C801 Malignant (primary) neoplasm, unspecified: Secondary | ICD-10-CM | POA: Diagnosis not present

## 2019-03-16 DIAGNOSIS — C771 Secondary and unspecified malignant neoplasm of intrathoracic lymph nodes: Secondary | ICD-10-CM | POA: Diagnosis not present

## 2019-03-16 DIAGNOSIS — C7951 Secondary malignant neoplasm of bone: Secondary | ICD-10-CM | POA: Diagnosis not present

## 2019-03-16 DIAGNOSIS — C50111 Malignant neoplasm of central portion of right female breast: Secondary | ICD-10-CM | POA: Diagnosis not present

## 2019-03-17 DIAGNOSIS — R221 Localized swelling, mass and lump, neck: Secondary | ICD-10-CM | POA: Diagnosis not present

## 2019-03-17 DIAGNOSIS — C778 Secondary and unspecified malignant neoplasm of lymph nodes of multiple regions: Secondary | ICD-10-CM | POA: Diagnosis not present

## 2019-03-17 DIAGNOSIS — C50212 Malignant neoplasm of upper-inner quadrant of left female breast: Secondary | ICD-10-CM | POA: Diagnosis not present

## 2019-03-17 DIAGNOSIS — C7951 Secondary malignant neoplasm of bone: Secondary | ICD-10-CM | POA: Diagnosis not present

## 2019-03-17 DIAGNOSIS — C50111 Malignant neoplasm of central portion of right female breast: Secondary | ICD-10-CM

## 2019-03-17 DIAGNOSIS — R220 Localized swelling, mass and lump, head: Secondary | ICD-10-CM | POA: Diagnosis not present

## 2019-03-21 DIAGNOSIS — C7951 Secondary malignant neoplasm of bone: Secondary | ICD-10-CM | POA: Diagnosis not present

## 2019-03-21 DIAGNOSIS — C50111 Malignant neoplasm of central portion of right female breast: Secondary | ICD-10-CM | POA: Diagnosis not present

## 2019-03-28 DIAGNOSIS — J90 Pleural effusion, not elsewhere classified: Secondary | ICD-10-CM | POA: Diagnosis not present

## 2019-03-28 DIAGNOSIS — C771 Secondary and unspecified malignant neoplasm of intrathoracic lymph nodes: Secondary | ICD-10-CM | POA: Diagnosis not present

## 2019-03-29 DIAGNOSIS — R933 Abnormal findings on diagnostic imaging of other parts of digestive tract: Secondary | ICD-10-CM | POA: Diagnosis not present

## 2019-04-04 DIAGNOSIS — K6289 Other specified diseases of anus and rectum: Secondary | ICD-10-CM | POA: Diagnosis not present

## 2019-04-04 DIAGNOSIS — K629 Disease of anus and rectum, unspecified: Secondary | ICD-10-CM | POA: Diagnosis not present

## 2019-04-04 DIAGNOSIS — R933 Abnormal findings on diagnostic imaging of other parts of digestive tract: Secondary | ICD-10-CM | POA: Diagnosis not present

## 2019-04-04 DIAGNOSIS — K250 Acute gastric ulcer with hemorrhage: Secondary | ICD-10-CM | POA: Diagnosis not present

## 2019-04-04 DIAGNOSIS — R195 Other fecal abnormalities: Secondary | ICD-10-CM | POA: Diagnosis not present

## 2019-04-06 DIAGNOSIS — C50111 Malignant neoplasm of central portion of right female breast: Secondary | ICD-10-CM | POA: Diagnosis not present

## 2019-04-06 DIAGNOSIS — C778 Secondary and unspecified malignant neoplasm of lymph nodes of multiple regions: Secondary | ICD-10-CM | POA: Diagnosis not present

## 2019-04-06 DIAGNOSIS — C7951 Secondary malignant neoplasm of bone: Secondary | ICD-10-CM | POA: Diagnosis not present

## 2019-04-08 DIAGNOSIS — C50512 Malignant neoplasm of lower-outer quadrant of left female breast: Secondary | ICD-10-CM | POA: Diagnosis not present

## 2019-04-08 DIAGNOSIS — C771 Secondary and unspecified malignant neoplasm of intrathoracic lymph nodes: Secondary | ICD-10-CM | POA: Diagnosis not present

## 2019-04-08 DIAGNOSIS — Z510 Encounter for antineoplastic radiation therapy: Secondary | ICD-10-CM | POA: Diagnosis not present

## 2019-04-14 DIAGNOSIS — Z510 Encounter for antineoplastic radiation therapy: Secondary | ICD-10-CM | POA: Diagnosis not present

## 2019-04-14 DIAGNOSIS — C50111 Malignant neoplasm of central portion of right female breast: Secondary | ICD-10-CM | POA: Diagnosis not present

## 2019-04-15 DIAGNOSIS — C771 Secondary and unspecified malignant neoplasm of intrathoracic lymph nodes: Secondary | ICD-10-CM | POA: Diagnosis not present

## 2019-04-18 DIAGNOSIS — C771 Secondary and unspecified malignant neoplasm of intrathoracic lymph nodes: Secondary | ICD-10-CM | POA: Diagnosis not present

## 2019-04-18 DIAGNOSIS — C50111 Malignant neoplasm of central portion of right female breast: Secondary | ICD-10-CM | POA: Diagnosis not present

## 2019-04-18 DIAGNOSIS — Z510 Encounter for antineoplastic radiation therapy: Secondary | ICD-10-CM | POA: Diagnosis not present

## 2019-04-19 DIAGNOSIS — Z510 Encounter for antineoplastic radiation therapy: Secondary | ICD-10-CM | POA: Diagnosis not present

## 2019-04-19 DIAGNOSIS — C50111 Malignant neoplasm of central portion of right female breast: Secondary | ICD-10-CM | POA: Diagnosis not present

## 2019-04-19 DIAGNOSIS — C771 Secondary and unspecified malignant neoplasm of intrathoracic lymph nodes: Secondary | ICD-10-CM | POA: Diagnosis not present

## 2019-04-20 DIAGNOSIS — C7951 Secondary malignant neoplasm of bone: Secondary | ICD-10-CM | POA: Diagnosis not present

## 2019-04-20 DIAGNOSIS — C771 Secondary and unspecified malignant neoplasm of intrathoracic lymph nodes: Secondary | ICD-10-CM | POA: Diagnosis not present

## 2019-04-20 DIAGNOSIS — C50111 Malignant neoplasm of central portion of right female breast: Secondary | ICD-10-CM | POA: Diagnosis not present

## 2019-04-20 DIAGNOSIS — C778 Secondary and unspecified malignant neoplasm of lymph nodes of multiple regions: Secondary | ICD-10-CM | POA: Diagnosis not present

## 2019-04-21 DIAGNOSIS — C771 Secondary and unspecified malignant neoplasm of intrathoracic lymph nodes: Secondary | ICD-10-CM | POA: Diagnosis not present

## 2019-04-21 DIAGNOSIS — Z510 Encounter for antineoplastic radiation therapy: Secondary | ICD-10-CM | POA: Diagnosis not present

## 2019-04-21 DIAGNOSIS — C50111 Malignant neoplasm of central portion of right female breast: Secondary | ICD-10-CM | POA: Diagnosis not present

## 2019-04-22 DIAGNOSIS — C771 Secondary and unspecified malignant neoplasm of intrathoracic lymph nodes: Secondary | ICD-10-CM | POA: Diagnosis not present

## 2019-04-22 DIAGNOSIS — C50111 Malignant neoplasm of central portion of right female breast: Secondary | ICD-10-CM | POA: Diagnosis not present

## 2019-04-22 DIAGNOSIS — Z510 Encounter for antineoplastic radiation therapy: Secondary | ICD-10-CM | POA: Diagnosis not present

## 2019-04-25 DIAGNOSIS — C771 Secondary and unspecified malignant neoplasm of intrathoracic lymph nodes: Secondary | ICD-10-CM | POA: Diagnosis not present

## 2019-04-25 DIAGNOSIS — C50111 Malignant neoplasm of central portion of right female breast: Secondary | ICD-10-CM | POA: Diagnosis not present

## 2019-04-25 DIAGNOSIS — Z510 Encounter for antineoplastic radiation therapy: Secondary | ICD-10-CM | POA: Diagnosis not present

## 2019-04-26 DIAGNOSIS — C771 Secondary and unspecified malignant neoplasm of intrathoracic lymph nodes: Secondary | ICD-10-CM | POA: Diagnosis not present

## 2019-04-26 DIAGNOSIS — Z510 Encounter for antineoplastic radiation therapy: Secondary | ICD-10-CM | POA: Diagnosis not present

## 2019-04-26 DIAGNOSIS — C50111 Malignant neoplasm of central portion of right female breast: Secondary | ICD-10-CM | POA: Diagnosis not present

## 2019-04-27 DIAGNOSIS — Z510 Encounter for antineoplastic radiation therapy: Secondary | ICD-10-CM | POA: Diagnosis not present

## 2019-04-27 DIAGNOSIS — C50111 Malignant neoplasm of central portion of right female breast: Secondary | ICD-10-CM | POA: Diagnosis not present

## 2019-04-27 DIAGNOSIS — C771 Secondary and unspecified malignant neoplasm of intrathoracic lymph nodes: Secondary | ICD-10-CM | POA: Diagnosis not present

## 2019-04-28 DIAGNOSIS — C50111 Malignant neoplasm of central portion of right female breast: Secondary | ICD-10-CM | POA: Diagnosis not present

## 2019-04-28 DIAGNOSIS — C771 Secondary and unspecified malignant neoplasm of intrathoracic lymph nodes: Secondary | ICD-10-CM | POA: Diagnosis not present

## 2019-04-28 DIAGNOSIS — Z510 Encounter for antineoplastic radiation therapy: Secondary | ICD-10-CM | POA: Diagnosis not present

## 2019-04-29 DIAGNOSIS — C50111 Malignant neoplasm of central portion of right female breast: Secondary | ICD-10-CM | POA: Diagnosis not present

## 2019-04-29 DIAGNOSIS — C771 Secondary and unspecified malignant neoplasm of intrathoracic lymph nodes: Secondary | ICD-10-CM | POA: Diagnosis not present

## 2019-04-29 DIAGNOSIS — Z510 Encounter for antineoplastic radiation therapy: Secondary | ICD-10-CM | POA: Diagnosis not present

## 2019-05-02 DIAGNOSIS — C50111 Malignant neoplasm of central portion of right female breast: Secondary | ICD-10-CM | POA: Diagnosis not present

## 2019-05-02 DIAGNOSIS — Z510 Encounter for antineoplastic radiation therapy: Secondary | ICD-10-CM | POA: Diagnosis not present

## 2019-05-02 DIAGNOSIS — C771 Secondary and unspecified malignant neoplasm of intrathoracic lymph nodes: Secondary | ICD-10-CM | POA: Diagnosis not present

## 2019-05-03 DIAGNOSIS — C50111 Malignant neoplasm of central portion of right female breast: Secondary | ICD-10-CM | POA: Diagnosis not present

## 2019-05-03 DIAGNOSIS — C771 Secondary and unspecified malignant neoplasm of intrathoracic lymph nodes: Secondary | ICD-10-CM | POA: Diagnosis not present

## 2019-05-03 DIAGNOSIS — Z510 Encounter for antineoplastic radiation therapy: Secondary | ICD-10-CM | POA: Diagnosis not present

## 2019-05-04 DIAGNOSIS — C792 Secondary malignant neoplasm of skin: Secondary | ICD-10-CM | POA: Diagnosis not present

## 2019-05-04 DIAGNOSIS — C50411 Malignant neoplasm of upper-outer quadrant of right female breast: Secondary | ICD-10-CM | POA: Diagnosis not present

## 2019-05-04 DIAGNOSIS — C50111 Malignant neoplasm of central portion of right female breast: Secondary | ICD-10-CM | POA: Diagnosis not present

## 2019-05-04 DIAGNOSIS — C55 Malignant neoplasm of uterus, part unspecified: Secondary | ICD-10-CM | POA: Diagnosis not present

## 2019-05-04 DIAGNOSIS — C771 Secondary and unspecified malignant neoplasm of intrathoracic lymph nodes: Secondary | ICD-10-CM | POA: Diagnosis not present

## 2019-05-05 DIAGNOSIS — C50111 Malignant neoplasm of central portion of right female breast: Secondary | ICD-10-CM | POA: Diagnosis not present

## 2019-05-05 DIAGNOSIS — Z20822 Contact with and (suspected) exposure to covid-19: Secondary | ICD-10-CM | POA: Diagnosis not present

## 2019-05-05 DIAGNOSIS — Z510 Encounter for antineoplastic radiation therapy: Secondary | ICD-10-CM | POA: Diagnosis not present

## 2019-05-05 DIAGNOSIS — C50919 Malignant neoplasm of unspecified site of unspecified female breast: Secondary | ICD-10-CM | POA: Diagnosis not present

## 2019-05-05 DIAGNOSIS — C771 Secondary and unspecified malignant neoplasm of intrathoracic lymph nodes: Secondary | ICD-10-CM | POA: Diagnosis not present

## 2019-05-06 DIAGNOSIS — C50919 Malignant neoplasm of unspecified site of unspecified female breast: Secondary | ICD-10-CM | POA: Diagnosis not present

## 2019-05-06 DIAGNOSIS — Z452 Encounter for adjustment and management of vascular access device: Secondary | ICD-10-CM | POA: Diagnosis not present

## 2019-05-06 DIAGNOSIS — K5909 Other constipation: Secondary | ICD-10-CM | POA: Diagnosis not present

## 2019-05-06 DIAGNOSIS — F418 Other specified anxiety disorders: Secondary | ICD-10-CM | POA: Diagnosis not present

## 2019-05-06 DIAGNOSIS — E039 Hypothyroidism, unspecified: Secondary | ICD-10-CM | POA: Diagnosis not present

## 2019-05-06 DIAGNOSIS — C7981 Secondary malignant neoplasm of breast: Secondary | ICD-10-CM | POA: Diagnosis not present

## 2019-05-06 DIAGNOSIS — Z79899 Other long term (current) drug therapy: Secondary | ICD-10-CM | POA: Diagnosis not present

## 2019-05-06 DIAGNOSIS — C771 Secondary and unspecified malignant neoplasm of intrathoracic lymph nodes: Secondary | ICD-10-CM | POA: Diagnosis not present

## 2019-05-10 DIAGNOSIS — C7951 Secondary malignant neoplasm of bone: Secondary | ICD-10-CM | POA: Diagnosis not present

## 2019-05-10 DIAGNOSIS — C50111 Malignant neoplasm of central portion of right female breast: Secondary | ICD-10-CM | POA: Diagnosis not present

## 2019-05-10 DIAGNOSIS — C778 Secondary and unspecified malignant neoplasm of lymph nodes of multiple regions: Secondary | ICD-10-CM | POA: Diagnosis not present

## 2019-05-30 DIAGNOSIS — R609 Edema, unspecified: Secondary | ICD-10-CM | POA: Diagnosis not present

## 2019-05-30 DIAGNOSIS — C785 Secondary malignant neoplasm of large intestine and rectum: Secondary | ICD-10-CM | POA: Diagnosis not present

## 2019-05-30 DIAGNOSIS — C50111 Malignant neoplasm of central portion of right female breast: Secondary | ICD-10-CM | POA: Diagnosis not present

## 2019-05-30 DIAGNOSIS — I890 Lymphedema, not elsewhere classified: Secondary | ICD-10-CM | POA: Diagnosis not present

## 2019-05-30 DIAGNOSIS — D509 Iron deficiency anemia, unspecified: Secondary | ICD-10-CM | POA: Diagnosis not present

## 2019-05-30 DIAGNOSIS — R0989 Other specified symptoms and signs involving the circulatory and respiratory systems: Secondary | ICD-10-CM | POA: Diagnosis not present

## 2019-05-30 DIAGNOSIS — Z515 Encounter for palliative care: Secondary | ICD-10-CM | POA: Diagnosis not present

## 2019-05-30 DIAGNOSIS — C778 Secondary and unspecified malignant neoplasm of lymph nodes of multiple regions: Secondary | ICD-10-CM | POA: Diagnosis not present

## 2019-05-30 DIAGNOSIS — C7951 Secondary malignant neoplasm of bone: Secondary | ICD-10-CM | POA: Diagnosis not present

## 2019-06-02 ENCOUNTER — Encounter (HOSPITAL_COMMUNITY)
Admission: EM | Disposition: A | Discharge status: Home / Self Care | Payer: Self-pay | Source: Home / Self Care | Attending: Student

## 2019-06-02 ENCOUNTER — Inpatient Hospital Stay (HOSPITAL_COMMUNITY): Payer: BC Managed Care – PPO

## 2019-06-02 ENCOUNTER — Inpatient Hospital Stay (HOSPITAL_COMMUNITY): Payer: BC Managed Care – PPO | Admitting: Certified Registered Nurse Anesthetist

## 2019-06-02 ENCOUNTER — Emergency Department (HOSPITAL_COMMUNITY): Payer: BC Managed Care – PPO

## 2019-06-02 ENCOUNTER — Observation Stay (HOSPITAL_COMMUNITY)
Admission: EM | Admit: 2019-06-02 | Discharge: 2019-06-02 | Disposition: A | Discharge status: Home / Self Care | Payer: BC Managed Care – PPO | Attending: Student | Admitting: Student

## 2019-06-02 ENCOUNTER — Observation Stay (HOSPITAL_COMMUNITY): Payer: BC Managed Care – PPO

## 2019-06-02 ENCOUNTER — Encounter (HOSPITAL_COMMUNITY): Payer: Self-pay | Admitting: Emergency Medicine

## 2019-06-02 ENCOUNTER — Other Ambulatory Visit: Payer: Self-pay

## 2019-06-02 DIAGNOSIS — Z923 Personal history of irradiation: Secondary | ICD-10-CM | POA: Diagnosis not present

## 2019-06-02 DIAGNOSIS — Z7982 Long term (current) use of aspirin: Secondary | ICD-10-CM | POA: Diagnosis not present

## 2019-06-02 DIAGNOSIS — R42 Dizziness and giddiness: Secondary | ICD-10-CM | POA: Diagnosis not present

## 2019-06-02 DIAGNOSIS — R55 Syncope and collapse: Secondary | ICD-10-CM | POA: Diagnosis not present

## 2019-06-02 DIAGNOSIS — E039 Hypothyroidism, unspecified: Secondary | ICD-10-CM | POA: Diagnosis not present

## 2019-06-02 DIAGNOSIS — S82841A Displaced bimalleolar fracture of right lower leg, initial encounter for closed fracture: Principal | ICD-10-CM | POA: Insufficient documentation

## 2019-06-02 DIAGNOSIS — S9304XA Dislocation of right ankle joint, initial encounter: Secondary | ICD-10-CM

## 2019-06-02 DIAGNOSIS — S82851A Displaced trimalleolar fracture of right lower leg, initial encounter for closed fracture: Secondary | ICD-10-CM | POA: Diagnosis present

## 2019-06-02 DIAGNOSIS — Z03818 Encounter for observation for suspected exposure to other biological agents ruled out: Secondary | ICD-10-CM | POA: Diagnosis not present

## 2019-06-02 DIAGNOSIS — D649 Anemia, unspecified: Secondary | ICD-10-CM | POA: Diagnosis not present

## 2019-06-02 DIAGNOSIS — Z419 Encounter for procedure for purposes other than remedying health state, unspecified: Secondary | ICD-10-CM

## 2019-06-02 DIAGNOSIS — Z91040 Latex allergy status: Secondary | ICD-10-CM | POA: Insufficient documentation

## 2019-06-02 DIAGNOSIS — Z79899 Other long term (current) drug therapy: Secondary | ICD-10-CM | POA: Insufficient documentation

## 2019-06-02 DIAGNOSIS — S82831A Other fracture of upper and lower end of right fibula, initial encounter for closed fracture: Secondary | ICD-10-CM | POA: Diagnosis not present

## 2019-06-02 DIAGNOSIS — S82891A Other fracture of right lower leg, initial encounter for closed fracture: Secondary | ICD-10-CM | POA: Diagnosis present

## 2019-06-02 DIAGNOSIS — Z20822 Contact with and (suspected) exposure to covid-19: Secondary | ICD-10-CM | POA: Insufficient documentation

## 2019-06-02 DIAGNOSIS — Z9221 Personal history of antineoplastic chemotherapy: Secondary | ICD-10-CM | POA: Insufficient documentation

## 2019-06-02 DIAGNOSIS — Z880 Allergy status to penicillin: Secondary | ICD-10-CM | POA: Insufficient documentation

## 2019-06-02 DIAGNOSIS — W19XXXA Unspecified fall, initial encounter: Secondary | ICD-10-CM | POA: Insufficient documentation

## 2019-06-02 DIAGNOSIS — R0902 Hypoxemia: Secondary | ICD-10-CM | POA: Diagnosis not present

## 2019-06-02 DIAGNOSIS — M722 Plantar fascial fibromatosis: Secondary | ICD-10-CM | POA: Diagnosis not present

## 2019-06-02 DIAGNOSIS — T07XXXA Unspecified multiple injuries, initial encounter: Secondary | ICD-10-CM | POA: Diagnosis not present

## 2019-06-02 DIAGNOSIS — Z90710 Acquired absence of both cervix and uterus: Secondary | ICD-10-CM | POA: Insufficient documentation

## 2019-06-02 DIAGNOSIS — T148XXA Other injury of unspecified body region, initial encounter: Secondary | ICD-10-CM

## 2019-06-02 DIAGNOSIS — C50919 Malignant neoplasm of unspecified site of unspecified female breast: Secondary | ICD-10-CM | POA: Diagnosis not present

## 2019-06-02 DIAGNOSIS — S8251XA Displaced fracture of medial malleolus of right tibia, initial encounter for closed fracture: Secondary | ICD-10-CM | POA: Diagnosis not present

## 2019-06-02 DIAGNOSIS — Z853 Personal history of malignant neoplasm of breast: Secondary | ICD-10-CM | POA: Insufficient documentation

## 2019-06-02 DIAGNOSIS — S8254XA Nondisplaced fracture of medial malleolus of right tibia, initial encounter for closed fracture: Secondary | ICD-10-CM | POA: Diagnosis not present

## 2019-06-02 DIAGNOSIS — G8918 Other acute postprocedural pain: Secondary | ICD-10-CM | POA: Diagnosis not present

## 2019-06-02 DIAGNOSIS — S82401D Unspecified fracture of shaft of right fibula, subsequent encounter for closed fracture with routine healing: Secondary | ICD-10-CM | POA: Diagnosis not present

## 2019-06-02 DIAGNOSIS — S82451A Displaced comminuted fracture of shaft of right fibula, initial encounter for closed fracture: Secondary | ICD-10-CM | POA: Diagnosis not present

## 2019-06-02 DIAGNOSIS — R52 Pain, unspecified: Secondary | ICD-10-CM | POA: Diagnosis not present

## 2019-06-02 HISTORY — PX: ORIF ANKLE FRACTURE: SHX5408

## 2019-06-02 LAB — COMPREHENSIVE METABOLIC PANEL
ALT: 29 U/L (ref 0–44)
AST: 21 U/L (ref 15–41)
Albumin: 3.4 g/dL — ABNORMAL LOW (ref 3.5–5.0)
Alkaline Phosphatase: 40 U/L (ref 38–126)
Anion gap: 10 (ref 5–15)
BUN: 18 mg/dL (ref 8–23)
CO2: 25 mmol/L (ref 22–32)
Calcium: 9 mg/dL (ref 8.9–10.3)
Chloride: 101 mmol/L (ref 98–111)
Creatinine, Ser: 1.21 mg/dL — ABNORMAL HIGH (ref 0.44–1.00)
GFR calc Af Amer: 55 mL/min — ABNORMAL LOW (ref >60)
GFR calc non Af Amer: 47 mL/min — ABNORMAL LOW (ref >60)
Glucose, Bld: 99 mg/dL (ref 70–99)
Potassium: 3.8 mmol/L (ref 3.5–5.1)
Sodium: 136 mmol/L (ref 135–145)
Total Bilirubin: 0.8 mg/dL (ref 0.3–1.2)
Total Protein: 6.1 g/dL — ABNORMAL LOW (ref 6.5–8.1)

## 2019-06-02 LAB — CBC WITH DIFFERENTIAL/PLATELET
Abs Immature Granulocytes: 0.04 10*3/uL (ref 0.00–0.07)
Basophils Absolute: 0 10*3/uL (ref 0.0–0.1)
Basophils Relative: 1 %
Eosinophils Absolute: 0.1 10*3/uL (ref 0.0–0.5)
Eosinophils Relative: 2 %
HCT: 34.8 % — ABNORMAL LOW (ref 36.0–46.0)
Hemoglobin: 10.8 g/dL — ABNORMAL LOW (ref 12.0–15.0)
Immature Granulocytes: 1 %
Lymphocytes Relative: 4 %
Lymphs Abs: 0.2 10*3/uL — ABNORMAL LOW (ref 0.7–4.0)
MCH: 26.7 pg (ref 26.0–34.0)
MCHC: 31 g/dL (ref 30.0–36.0)
MCV: 86.1 fL (ref 80.0–100.0)
Monocytes Absolute: 0.1 10*3/uL (ref 0.1–1.0)
Monocytes Relative: 2 %
Neutro Abs: 5.4 10*3/uL (ref 1.7–7.7)
Neutrophils Relative %: 90 %
Platelets: 152 10*3/uL (ref 150–400)
RBC: 4.04 MIL/uL (ref 3.87–5.11)
RDW: 20.2 % — ABNORMAL HIGH (ref 11.5–15.5)
WBC: 5.9 10*3/uL (ref 4.0–10.5)
nRBC: 0 % (ref 0.0–0.2)

## 2019-06-02 LAB — CBC
HCT: 34.3 % — ABNORMAL LOW (ref 36.0–46.0)
Hemoglobin: 10.7 g/dL — ABNORMAL LOW (ref 12.0–15.0)
MCH: 27.3 pg (ref 26.0–34.0)
MCHC: 31.2 g/dL (ref 30.0–36.0)
MCV: 87.5 fL (ref 80.0–100.0)
Platelets: 165 10*3/uL (ref 150–400)
RBC: 3.92 MIL/uL (ref 3.87–5.11)
RDW: 20.3 % — ABNORMAL HIGH (ref 11.5–15.5)
WBC: 5.6 10*3/uL (ref 4.0–10.5)
nRBC: 0 % (ref 0.0–0.2)

## 2019-06-02 LAB — RESPIRATORY PANEL BY RT PCR (FLU A&B, COVID)
Influenza A by PCR: NEGATIVE
Influenza B by PCR: NEGATIVE
SARS Coronavirus 2 by RT PCR: NEGATIVE

## 2019-06-02 LAB — NO BLOOD PRODUCTS

## 2019-06-02 SURGERY — OPEN REDUCTION INTERNAL FIXATION (ORIF) ANKLE FRACTURE
Anesthesia: Regional | Site: Ankle | Laterality: Right

## 2019-06-02 MED ORDER — PROPOFOL 10 MG/ML IV BOLUS
INTRAVENOUS | Status: AC
  Filled 2019-06-02: qty 20

## 2019-06-02 MED ORDER — LACTATED RINGERS IV SOLN: INTRAVENOUS | Status: DC

## 2019-06-02 MED ORDER — DOCUSATE SODIUM 100 MG PO CAPS: 100.0000 mg | ORAL_CAPSULE | Freq: Two times a day (BID) | ORAL | Status: DC

## 2019-06-02 MED ORDER — TRAZODONE HCL 50 MG PO TABS: 150.0000 mg | ORAL_TABLET | Freq: Every day | ORAL | Status: DC

## 2019-06-02 MED ORDER — PHENYLEPHRINE 40 MCG/ML (10ML) SYRINGE FOR IV PUSH (FOR BLOOD PRESSURE SUPPORT)
PREFILLED_SYRINGE | INTRAVENOUS | Status: AC
  Filled 2019-06-02: qty 10

## 2019-06-02 MED ORDER — ONDANSETRON HCL 4 MG/2ML IJ SOLN
INTRAMUSCULAR | Status: AC
  Filled 2019-06-02: qty 2

## 2019-06-02 MED ORDER — POTASSIUM CHLORIDE IN NACL 20-0.9 MEQ/L-% IV SOLN
INTRAVENOUS | Status: DC
  Filled 2019-06-02: qty 1000

## 2019-06-02 MED ORDER — FENTANYL CITRATE (PF) 100 MCG/2ML IJ SOLN: 100.0000 ug | Freq: Once | INTRAMUSCULAR | Status: AC

## 2019-06-02 MED ORDER — ONDANSETRON HCL 4 MG PO TABS: 4.0000 mg | ORAL_TABLET | Freq: Four times a day (QID) | ORAL | Status: DC | PRN

## 2019-06-02 MED ORDER — OXYCODONE HCL 5 MG PO TABS: 5.0000 mg | ORAL_TABLET | Freq: Once | ORAL | Status: DC | PRN

## 2019-06-02 MED ORDER — PROPOFOL 10 MG/ML IV BOLUS
INTRAVENOUS | Status: DC | PRN
  Administered 2019-06-02: 150 mg via INTRAVENOUS
  Administered 2019-06-02: 50 mg via INTRAVENOUS

## 2019-06-02 MED ORDER — VANCOMYCIN HCL 1000 MG IV SOLR
INTRAVENOUS | Status: AC
  Filled 2019-06-02: qty 1000

## 2019-06-02 MED ORDER — ONDANSETRON HCL 4 MG/2ML IJ SOLN: 4.0000 mg | Freq: Four times a day (QID) | INTRAMUSCULAR | Status: DC | PRN

## 2019-06-02 MED ORDER — ROCURONIUM BROMIDE 10 MG/ML (PF) SYRINGE
PREFILLED_SYRINGE | INTRAVENOUS | Status: AC
  Filled 2019-06-02: qty 10

## 2019-06-02 MED ORDER — CEFAZOLIN SODIUM 1 G IJ SOLR
INTRAMUSCULAR | Status: AC
  Filled 2019-06-02: qty 30

## 2019-06-02 MED ORDER — ESCITALOPRAM OXALATE 10 MG PO TABS: 10.0000 mg | ORAL_TABLET | Freq: Every day | ORAL | Status: DC

## 2019-06-02 MED ORDER — HYDROCODONE-ACETAMINOPHEN 5-325 MG PO TABS: 1.0000 | ORAL_TABLET | ORAL | Status: DC | PRN

## 2019-06-02 MED ORDER — METOCLOPRAMIDE HCL 5 MG/ML IJ SOLN: 5.0000 mg | Freq: Three times a day (TID) | INTRAMUSCULAR | Status: DC | PRN

## 2019-06-02 MED ORDER — ALBUMIN HUMAN 5 % IV SOLN: INTRAVENOUS | Status: DC | PRN

## 2019-06-02 MED ORDER — SUCCINYLCHOLINE CHLORIDE 200 MG/10ML IV SOSY
PREFILLED_SYRINGE | INTRAVENOUS | Status: AC
  Filled 2019-06-02: qty 10

## 2019-06-02 MED ORDER — MIDAZOLAM HCL 2 MG/2ML IJ SOLN
INTRAMUSCULAR | Status: AC
  Administered 2019-06-02: 2 mg via INTRAVENOUS
  Filled 2019-06-02: qty 2

## 2019-06-02 MED ORDER — HYDROMORPHONE HCL 1 MG/ML IJ SOLN: 0.2500 mg | INTRAMUSCULAR | Status: DC | PRN

## 2019-06-02 MED ORDER — METHOCARBAMOL 500 MG PO TABS: 500.0000 mg | ORAL_TABLET | Freq: Four times a day (QID) | ORAL | Status: DC | PRN

## 2019-06-02 MED ORDER — DEXAMETHASONE SODIUM PHOSPHATE 10 MG/ML IJ SOLN
INTRAMUSCULAR | Status: AC
  Filled 2019-06-02: qty 1

## 2019-06-02 MED ORDER — MIDAZOLAM HCL 2 MG/2ML IJ SOLN
INTRAMUSCULAR | Status: AC
  Filled 2019-06-02: qty 2

## 2019-06-02 MED ORDER — MORPHINE SULFATE (PF) 2 MG/ML IV SOLN: 0.5000 mg | INTRAVENOUS | Status: DC | PRN

## 2019-06-02 MED ORDER — SODIUM CHLORIDE 0.9 % IV SOLN: INTRAVENOUS | Status: DC

## 2019-06-02 MED ORDER — ONDANSETRON HCL 4 MG/2ML IJ SOLN
INTRAMUSCULAR | Status: DC | PRN
  Administered 2019-06-02: 4 mg via INTRAVENOUS

## 2019-06-02 MED ORDER — POTASSIUM CHLORIDE ER 10 MEQ PO TBCR: 10.0000 meq | EXTENDED_RELEASE_TABLET | Freq: Two times a day (BID) | ORAL | Status: DC

## 2019-06-02 MED ORDER — METOCLOPRAMIDE HCL 10 MG PO TABS: 5.0000 mg | ORAL_TABLET | Freq: Three times a day (TID) | ORAL | Status: DC | PRN

## 2019-06-02 MED ORDER — CLONIDINE HCL (ANALGESIA) 100 MCG/ML EP SOLN
EPIDURAL | Status: DC | PRN
  Administered 2019-06-02: 150 ug

## 2019-06-02 MED ORDER — LORAZEPAM 0.5 MG PO TABS: 0.5000 mg | ORAL_TABLET | Freq: Every day | ORAL | Status: DC | PRN

## 2019-06-02 MED ORDER — CALCITRIOL 0.25 MCG PO CAPS
0.2500 ug | ORAL_CAPSULE | Freq: Every day | ORAL | Status: DC
  Filled 2019-06-02: qty 1

## 2019-06-02 MED ORDER — EPHEDRINE 5 MG/ML INJ
INTRAVENOUS | Status: AC
  Filled 2019-06-02: qty 10

## 2019-06-02 MED ORDER — SODIUM CHLORIDE 0.9 % IV BOLUS
1000.0000 mL | Freq: Once | INTRAVENOUS | Status: AC
  Administered 2019-06-02: 1000 mL via INTRAVENOUS

## 2019-06-02 MED ORDER — FENTANYL CITRATE (PF) 250 MCG/5ML IJ SOLN
INTRAMUSCULAR | Status: AC
  Filled 2019-06-02: qty 5

## 2019-06-02 MED ORDER — ENOXAPARIN SODIUM 40 MG/0.4ML ~~LOC~~ SOLN: 40.0000 mg | SUBCUTANEOUS | Status: DC

## 2019-06-02 MED ORDER — HYDROCODONE-ACETAMINOPHEN 5-325 MG PO TABS: 1.0000 | ORAL_TABLET | Freq: Four times a day (QID) | ORAL | 0 refills | Status: DC | PRN

## 2019-06-02 MED ORDER — EXEMESTANE 25 MG PO TABS
25.0000 mg | ORAL_TABLET | Freq: Every day | ORAL | Status: DC
  Filled 2019-06-02: qty 1

## 2019-06-02 MED ORDER — PROMETHAZINE HCL 25 MG/ML IJ SOLN: 6.2500 mg | INTRAMUSCULAR | Status: DC | PRN

## 2019-06-02 MED ORDER — ACETAMINOPHEN 325 MG PO TABS: 325.0000 mg | ORAL_TABLET | Freq: Four times a day (QID) | ORAL | Status: DC | PRN

## 2019-06-02 MED ORDER — ACETAMINOPHEN 10 MG/ML IV SOLN: 1000.0000 mg | Freq: Once | INTRAVENOUS | Status: DC | PRN

## 2019-06-02 MED ORDER — LEVOTHYROXINE SODIUM 50 MCG PO TABS: 50.0000 ug | ORAL_TABLET | Freq: Every day | ORAL | Status: DC

## 2019-06-02 MED ORDER — HYDROCODONE-ACETAMINOPHEN 7.5-325 MG PO TABS: 1.0000 | ORAL_TABLET | ORAL | Status: DC | PRN

## 2019-06-02 MED ORDER — FENTANYL CITRATE (PF) 100 MCG/2ML IJ SOLN
INTRAMUSCULAR | Status: AC
  Administered 2019-06-02: 100 ug via INTRAVENOUS
  Filled 2019-06-02: qty 2

## 2019-06-02 MED ORDER — EPHEDRINE SULFATE-NACL 50-0.9 MG/10ML-% IV SOSY
PREFILLED_SYRINGE | INTRAVENOUS | Status: DC | PRN
  Administered 2019-06-02: 5 mg via INTRAVENOUS

## 2019-06-02 MED ORDER — ASPIRIN EC 325 MG PO TBEC: 325.0000 mg | DELAYED_RELEASE_TABLET | Freq: Two times a day (BID) | ORAL | 0 refills | Status: AC

## 2019-06-02 MED ORDER — 0.9 % SODIUM CHLORIDE (POUR BTL) OPTIME
TOPICAL | Status: DC | PRN
  Administered 2019-06-02: 1000 mL

## 2019-06-02 MED ORDER — CEFAZOLIN SODIUM-DEXTROSE 2-3 GM-%(50ML) IV SOLR
INTRAVENOUS | Status: DC | PRN
  Administered 2019-06-02: 2 g via INTRAVENOUS

## 2019-06-02 MED ORDER — PHENYLEPHRINE HCL-NACL 10-0.9 MG/250ML-% IV SOLN
INTRAVENOUS | Status: DC | PRN
  Administered 2019-06-02: 50 ug/min via INTRAVENOUS

## 2019-06-02 MED ORDER — LIDOCAINE 2% (20 MG/ML) 5 ML SYRINGE
INTRAMUSCULAR | Status: AC
  Filled 2019-06-02: qty 5

## 2019-06-02 MED ORDER — ROPIVACAINE HCL 5 MG/ML IJ SOLN
INTRAMUSCULAR | Status: DC | PRN
  Administered 2019-06-02: 40 mL via PERINEURAL

## 2019-06-02 MED ORDER — MIDAZOLAM HCL 2 MG/2ML IJ SOLN: 2.0000 mg | Freq: Once | INTRAMUSCULAR | Status: AC

## 2019-06-02 MED ORDER — OXYCODONE HCL 5 MG/5ML PO SOLN: 5.0000 mg | Freq: Once | ORAL | Status: DC | PRN

## 2019-06-02 MED ORDER — LIDOCAINE 2% (20 MG/ML) 5 ML SYRINGE
INTRAMUSCULAR | Status: DC | PRN
  Administered 2019-06-02: 60 mg via INTRAVENOUS

## 2019-06-02 MED ORDER — FENTANYL CITRATE (PF) 100 MCG/2ML IJ SOLN
INTRAMUSCULAR | Status: DC | PRN
  Administered 2019-06-02: 50 ug via INTRAVENOUS

## 2019-06-02 MED ORDER — FENTANYL CITRATE (PF) 100 MCG/2ML IJ SOLN
50.0000 ug | Freq: Once | INTRAMUSCULAR | Status: AC
  Administered 2019-06-02: 50 ug via INTRAVENOUS
  Filled 2019-06-02: qty 2

## 2019-06-02 MED ORDER — VANCOMYCIN HCL 1000 MG IV SOLR
INTRAVENOUS | Status: DC | PRN
  Administered 2019-06-02: 1000 mg via TOPICAL

## 2019-06-02 MED ORDER — PHENYLEPHRINE 40 MCG/ML (10ML) SYRINGE FOR IV PUSH (FOR BLOOD PRESSURE SUPPORT)
PREFILLED_SYRINGE | INTRAVENOUS | Status: DC | PRN
  Administered 2019-06-02 (×3): 120 ug via INTRAVENOUS
  Administered 2019-06-02: 40 ug via INTRAVENOUS

## 2019-06-02 MED ORDER — METHOCARBAMOL 1000 MG/10ML IJ SOLN
500.0000 mg | Freq: Four times a day (QID) | INTRAVENOUS | Status: DC | PRN
  Filled 2019-06-02: qty 5

## 2019-06-02 MED ORDER — LIDOCAINE-EPINEPHRINE (PF) 2 %-1:200000 IJ SOLN
20.0000 mL | Freq: Once | INTRAMUSCULAR | Status: AC
  Administered 2019-06-02: 20 mL
  Filled 2019-06-02: qty 20

## 2019-06-02 MED ORDER — FUROSEMIDE 20 MG PO TABS: 20.0000 mg | ORAL_TABLET | Freq: Every day | ORAL | Status: DC | PRN

## 2019-06-02 MED ORDER — DEXAMETHASONE SODIUM PHOSPHATE 10 MG/ML IJ SOLN
INTRAMUSCULAR | Status: DC | PRN
  Administered 2019-06-02: 10 mg via INTRAVENOUS

## 2019-06-02 SURGICAL SUPPLY — 72 items
APL PRP STRL LF DISP 70% ISPRP (MISCELLANEOUS) ×1
BANDAGE ESMARK 6X9 LF (GAUZE/BANDAGES/DRESSINGS) ×1 IMPLANT
BIT DRILL QC 2.0 SHORT EVOS SM (DRILL) IMPLANT
BIT DRILL QC 2.5MM SHRT EVO SM (DRILL) IMPLANT
BNDG CMPR 9X6 STRL LF SNTH (GAUZE/BANDAGES/DRESSINGS) ×1
BNDG CMPR STD VLCR NS LF 5.8X4 (GAUZE/BANDAGES/DRESSINGS) ×1
BNDG COHESIVE 4X5 TAN STRL (GAUZE/BANDAGES/DRESSINGS) ×2 IMPLANT
BNDG ELASTIC 4X5.8 VLCR NS LF (GAUZE/BANDAGES/DRESSINGS) ×1 IMPLANT
BNDG ELASTIC 4X5.8 VLCR STR LF (GAUZE/BANDAGES/DRESSINGS) IMPLANT
BNDG ELASTIC 6X5.8 VLCR STR LF (GAUZE/BANDAGES/DRESSINGS) ×1 IMPLANT
BNDG ESMARK 6X9 LF (GAUZE/BANDAGES/DRESSINGS) ×2
BRUSH SCRUB EZ PLAIN DRY (MISCELLANEOUS) ×4 IMPLANT
CHLORAPREP W/TINT 26 (MISCELLANEOUS) ×2 IMPLANT
COVER SURGICAL LIGHT HANDLE (MISCELLANEOUS) ×2 IMPLANT
COVER WAND RF STERILE (DRAPES) ×2 IMPLANT
DRAPE C-ARM 42X72 X-RAY (DRAPES) ×2 IMPLANT
DRAPE C-ARMOR (DRAPES) ×2 IMPLANT
DRAPE ORTHO SPLIT 77X108 STRL (DRAPES) ×4
DRAPE SURG ORHT 6 SPLT 77X108 (DRAPES) ×2 IMPLANT
DRAPE U-SHAPE 47X51 STRL (DRAPES) ×2 IMPLANT
DRILL QC 2.0 SHORT EVOS SM (DRILL) ×2
DRILL QC 2.5MM SHORT EVOS SM (DRILL) ×2
DRSG ADAPTIC 3X8 NADH LF (GAUZE/BANDAGES/DRESSINGS) IMPLANT
DRSG MEPITEL 4X7.2 (GAUZE/BANDAGES/DRESSINGS) ×1 IMPLANT
ELECT REM PT RETURN 9FT ADLT (ELECTROSURGICAL) ×2
ELECTRODE REM PT RTRN 9FT ADLT (ELECTROSURGICAL) ×1 IMPLANT
GAUZE SPONGE 4X4 12PLY STRL (GAUZE/BANDAGES/DRESSINGS) ×1 IMPLANT
GLOVE BIO SURGEON STRL SZ 6.5 (GLOVE) ×6 IMPLANT
GLOVE BIO SURGEON STRL SZ7.5 (GLOVE) ×6 IMPLANT
GLOVE BIOGEL PI IND STRL 6.5 (GLOVE) ×1 IMPLANT
GLOVE BIOGEL PI IND STRL 7.5 (GLOVE) ×1 IMPLANT
GLOVE BIOGEL PI INDICATOR 6.5 (GLOVE) ×1
GLOVE BIOGEL PI INDICATOR 7.5 (GLOVE) ×1
GOWN STRL REUS W/ TWL LRG LVL3 (GOWN DISPOSABLE) ×2 IMPLANT
GOWN STRL REUS W/TWL LRG LVL3 (GOWN DISPOSABLE) ×4
K-WIRE 1.6 (WIRE) ×4
K-WIRE FX150X1.6XTROC PNT (WIRE) ×2
KIT TURNOVER KIT B (KITS) ×2 IMPLANT
KWIRE FX150X1.6XTROC PNT (WIRE) IMPLANT
MANIFOLD NEPTUNE II (INSTRUMENTS) ×2 IMPLANT
NDL HYPO 21X1.5 SAFETY (NEEDLE) IMPLANT
NDL HYPO 25GX1X1/2 BEV (NEEDLE) ×1 IMPLANT
NEEDLE HYPO 21X1.5 SAFETY (NEEDLE) IMPLANT
NEEDLE HYPO 25GX1X1/2 BEV (NEEDLE) ×2 IMPLANT
NS IRRIG 1000ML POUR BTL (IV SOLUTION) ×2 IMPLANT
PACK TOTAL JOINT (CUSTOM PROCEDURE TRAY) ×2 IMPLANT
PAD ARMBOARD 7.5X6 YLW CONV (MISCELLANEOUS) ×4 IMPLANT
PAD CAST 4YDX4 CTTN HI CHSV (CAST SUPPLIES) IMPLANT
PADDING CAST COTTON 4X4 STRL (CAST SUPPLIES) ×2
PADDING CAST COTTON 6X4 STRL (CAST SUPPLIES) ×1 IMPLANT
PLATE FIB EVOS 2.7/3.5 7H R103 (Plate) ×1 IMPLANT
SCREW CORT 2.7X14 T8 EVOS (Screw) ×1 IMPLANT
SCREW CORT 2.7X15 T8 ST EVOS (Screw) ×2 IMPLANT
SCREW CORT 2.7X16 STAR T8 EVOS (Screw) ×1 IMPLANT
SCREW CORT 3.5X13MM (Screw) ×1 IMPLANT
SCREW CORT EVOS ST 3.5X12 (Screw) ×2 IMPLANT
SCREW CORT ST EVOS 3.5X70 (Screw) ×2 IMPLANT
SCREW CTX 3.5X50MM EVOS (Screw) ×1 IMPLANT
SCREW LOCK 2.7X13 ST EVOS (Screw) ×2 IMPLANT
SPLINT PLASTER CAST XFAST 5X30 (CAST SUPPLIES) IMPLANT
SPLINT PLASTER XFAST SET 5X30 (CAST SUPPLIES) ×1
SPONGE LAP 18X18 RF (DISPOSABLE) IMPLANT
STAPLER VISISTAT 35W (STAPLE) ×2 IMPLANT
SUCTION FRAZIER HANDLE 10FR (MISCELLANEOUS) ×2
SUCTION TUBE FRAZIER 10FR DISP (MISCELLANEOUS) ×1 IMPLANT
SUT VIC AB 0 CT1 27 (SUTURE) ×2
SUT VIC AB 0 CT1 27XBRD ANBCTR (SUTURE) ×1 IMPLANT
SYR CONTROL 10ML LL (SYRINGE) ×2 IMPLANT
TOWEL GREEN STERILE (TOWEL DISPOSABLE) ×4 IMPLANT
TOWEL GREEN STERILE FF (TOWEL DISPOSABLE) ×2 IMPLANT
UNDERPAD 30X30 (UNDERPADS AND DIAPERS) ×2 IMPLANT
WATER STERILE IRR 1000ML POUR (IV SOLUTION) ×2 IMPLANT

## 2019-06-02 NOTE — Progress Notes (Signed)
Orthopedic Tech Progress Note Patient Details:  Stacy Park [birth year] [id number]  Ortho Devices Type of Ortho Device: Post (short leg) splint, Stirrup splint Ortho Device/Splint Location: rle. applied post reduction with drs assist. Ortho Device/Splint Interventions: Ordered, Application, Adjustment   Post Interventions Patient Tolerated: Well Instructions Provided: Care of device, Adjustment of device   Karolee Stamps 06/02/2019, 7:08 AM

## 2019-06-02 NOTE — Anesthesia Postprocedure Evaluation (Signed)
Anesthesia Post Note  Patient: Stacy Park  Procedure(s) Performed: OPEN REDUCTION INTERNAL FIXATION RIGHT ANKLE FRACTURE (Right Ankle)     Patient location during evaluation: PACU Anesthesia Type: Regional and General Level of consciousness: awake and alert Pain management: pain level controlled Vital Signs Assessment: post-procedure vital signs reviewed and stable Respiratory status: spontaneous breathing, nonlabored ventilation, respiratory function stable and patient connected to nasal cannula oxygen Cardiovascular status: blood pressure returned to baseline and stable Postop Assessment: no apparent nausea or vomiting Anesthetic complications: no    Last Vitals:  Vitals:   06/02/19 1613 06/02/19 1615  BP: (!) 109/55   Pulse: 75   Resp: 13   Temp:  36.6 C  SpO2: 96%     Last Pain:  Vitals:   06/02/19 1600  TempSrc:   PainSc: 0-No pain                 Talani Brazee P Harbor Paster

## 2019-06-02 NOTE — Interval H&P Note (Signed)
History and Physical Interval Note:  06/02/2019 12:12 PM  Stacy Park  has presented today for surgery, with the diagnosis of Right ankle fracture dislocation.  The various methods of treatment have been discussed with the patient and family. After consideration of risks, benefits and other options for treatment, the patient has consented to  Procedure(s): OPEN REDUCTION INTERNAL FIXATION (ORIF) ANKLE FRACTURE (Right) as a surgical intervention.  The patient's history has been reviewed, patient examined, no change in status, stable for surgery.  I have reviewed the patient's chart and labs.  Questions were answered to the patient's satisfaction.     Lennette Bihari P Zaleigh [place]

## 2019-06-02 NOTE — Consult Note (Signed)
Orthopaedic Trauma Service (OTS) Consult  Patient ID: Stacy Park MRN: [id number] DOB/AGE: [birth year] 64 y.o.  Reason for Consult: Right ankle fracture/dislocation Referring Physician: Montine Circle, PA-C [facility] ED)  HPI: Stacy Park is an 64 y.o. female being seen in consultation at the request of PA Marlon Pel for right ankle fracture/dislocation.  Patient got up to use the bathroom this morning around 2 AM and when heading back to bed felt lightheaded and passed out.  Was brought to the emergency room for evaluation and found to have a right ankle fracture dislocation.  Orthopedic trauma service was consulted, recommended fracture reduction by ED provider and subsequent splinting of the ankle.  Successful reduction of the ankle performed by ED provider, confirmed with postreduction x-rays.  Patient seen this morning in the emergency department.  Pain is currently well controlled.  Denies any significant numbness or tingling.  Patient unsure why she may have passed out.  Denies any chest pain or shortness of breath leading up to the event.  Denies any other associated symptoms at the time.  Patient states she feels fine now.  She does have a history of breast cancer which she was treated for in the past.  Also notes that she is actively being treated for breast cancer with chemo/radiation. She is not currently on anticoagulation/  Past Medical History:  Diagnosis Date  . Breast cancer ([place])    S/P chemoradiation  . Cellulitis    Rt Ankle  . Hypothyroidism   . Mechanical complication of other vascular device, implant, and graft   . Plantar fasciitis   . Tenosynovitis of ankle     Past Surgical History:  Procedure Laterality Date  . ABDOMINAL HYSTERECTOMY    . centeral IV  age 75years old   with Tunneling and subcutan Port  . CESAREAN SECTION    . MASTECTOMY    . PORT-A-CATH REMOVAL  06/2010   Dr Melynda Ripple  . transcatheter foreign body  retrieval  08/20/2010, Dr Dyke Maes   retained catheter fragment in ri heart related to a severed Port A Cath    No family history on file.  Social History:  reports that she has never smoked. She has never used smokeless tobacco. She reports that she does not drink alcohol or use drugs.  Allergies:  Allergies  Allergen Reactions  . Latex Rash    Per pt "tears skin"  . Penicillins Rash    Medications: I have reviewed the patient's current medications.  ROS: Constitutional: No fever or chills Vision: No changes in vision ENT: No difficulty swallowing CV: No chest pain Pulm: No SOB or wheezing GI: No nausea or vomiting GU: No urgency or inability to hold urine Skin: No poor wound healing Neurologic: No numbness or tingling Psychiatric: No depression or anxiety Heme: No bruising Allergic: No reaction to medications or food   Exam: Blood pressure (!) 99/58, pulse 70, temperature 98.5 F (36.9 C), temperature source Oral, resp. rate 14, height 5\' 6"  (1.676 m), weight 70 kg, SpO2 97 %. General: Sitting up in bed, NAD Orientation: Alert and oriented x 3 Mood and Affect: Mood and affect appropriate. Pleasant and cooperative Gait: Not assessed due to known fracture Coordination and balance: Within normal limits  Right Lower extremity: Short leg splint in place. Non-tender above splint. No significant swelling through forefoot or toes. Able to wiggles. Extremity warm. 2+ DP pulse  Left Lower Extremity: Skin without lesions. No tenderness to palpation. Full painless ROM, full  strength in each muscle groups without evidence of instability.   Medical Decision Making: Data: Imaging: AP and lateral views of the right ankle show fracture through both the distal fibula as well as the medial malleolus.   Labs:  Results for orders placed or performed during the hospital encounter of 06/02/19 (from the past 24 hour(s))  CBC with Differential     Status: Abnormal   Collection Time:  06/02/19  4:27 AM  Result Value Ref Range   WBC 5.9 4.0 - 10.5 K/uL   RBC 4.04 3.87 - 5.11 MIL/uL   Hemoglobin 10.8 (L) 12.0 - 15.0 g/dL   HCT 34.8 (L) 36.0 - 46.0 %   MCV 86.1 80.0 - 100.0 fL   MCH 26.7 26.0 - 34.0 pg   MCHC 31.0 30.0 - 36.0 g/dL   RDW 20.2 (H) 11.5 - 15.5 %   Platelets 152 150 - 400 K/uL   nRBC 0.0 0.0 - 0.2 %   Neutrophils Relative % 90 %   Neutro Abs 5.4 1.7 - 7.7 K/uL   Lymphocytes Relative 4 %   Lymphs Abs 0.2 (L) 0.7 - 4.0 K/uL   Monocytes Relative 2 %   Monocytes Absolute 0.1 0.1 - 1.0 K/uL   Eosinophils Relative 2 %   Eosinophils Absolute 0.1 0.0 - 0.5 K/uL   Basophils Relative 1 %   Basophils Absolute 0.0 0.0 - 0.1 K/uL   Immature Granulocytes 1 %   Abs Immature Granulocytes 0.04 0.00 - 0.07 K/uL  Comprehensive metabolic panel     Status: Abnormal   Collection Time: 06/02/19  4:27 AM  Result Value Ref Range   Sodium 136 135 - 145 mmol/L   Potassium 3.8 3.5 - 5.1 mmol/L   Chloride 101 98 - 111 mmol/L   CO2 25 22 - 32 mmol/L   Glucose, Bld 99 70 - 99 mg/dL   BUN 18 8 - 23 mg/dL   Creatinine, Ser 1.21 (H) 0.44 - 1.00 mg/dL   Calcium 9.0 8.9 - 10.3 mg/dL   Total Protein 6.1 (L) 6.5 - 8.1 g/dL   Albumin 3.4 (L) 3.5 - 5.0 g/dL   AST 21 15 - 41 U/L   ALT 29 0 - 44 U/L   Alkaline Phosphatase 40 38 - 126 U/L   Total Bilirubin 0.8 0.3 - 1.2 mg/dL   GFR calc non Af Amer 47 (L) >60 mL/min   GFR calc Af Amer 55 (L) >60 mL/min   Anion gap 10 5 - 15  Respiratory Panel by RT PCR (Flu A&B, Covid) - Nasopharyngeal Swab     Status: None   Collection Time: 06/02/19  6:11 AM   Specimen: Nasopharyngeal Swab  Result Value Ref Range   SARS Coronavirus 2 by RT PCR NEGATIVE NEGATIVE   Influenza A by PCR NEGATIVE NEGATIVE   Influenza B by PCR NEGATIVE NEGATIVE     Assessment/Plan: 64 year old female with right ankle fracture dislocation.  Patient has sustained a significant injury to the right lower extremity, requiring surgical fixation.  Recommend proceeding  to the operating room today.  We will plan for open reduction internal fixation of the right ankle.  If soft tissue swelling appears to be too significant we will plan to proceed with external fixation of the right ankle to allow the soft tissues time to rest.  The risk and benefits of the procedure were discussed with the patient. Risks discussed included bleeding, infection, malunion, nonunion, damage to surrounding nerves and blood vessels, pain, hardware  prominence or irritation, hardware failure, stiffness, post-traumatic arthritis, DVT/PE, compartment syndrome, and complications associated with anesthesia.  Patient states understanding of these risks and agrees to proceed with surgery.  All questions answered.  Consent will be obtained.    Beckhem Isadore A. Carmie [facility] [phone] (office) [email or web address]

## 2019-06-02 NOTE — Discharge Instructions (Addendum)
Orthopaedic Trauma Service Discharge Instructions   General Discharge Instructions  WEIGHT BEARING STATUS: Non-weightbearing right lower extremity   RANGE OF MOTION/ACTIVITY: Do not remove splint. Okay for knee motion  Wound Care: Do not remove splint. Okay to shower, keep splint clean and dry when doing so.  DVT/PE prophylaxis: Aspirin 325 mg daily.  Diet: as you were eating previously.  Can use over the counter stool softeners and bowel preparations, such as Miralax, to help with bowel movements.  Narcotics can be constipating.  Be sure to drink plenty of fluids  PAIN MEDICATION USE AND EXPECTATIONS  You have likely been given narcotic medications to help control your pain.  After a traumatic event that results in an fracture (broken bone) with or without surgery, it is ok to use narcotic pain medications to help control one's pain.  We understand that everyone responds to pain differently and each individual patient will be evaluated on a regular basis for the continued need for narcotic medications. Ideally, narcotic medication use should last no more than 6-8 weeks (coinciding with fracture healing).   As a patient it is your responsibility as well to monitor narcotic medication use and report the amount and frequency you use these medications when you come to your office visit.   We would also advise that if you are using narcotic medications, you should take a dose prior to therapy to maximize you participation.  IF YOU ARE ON NARCOTIC MEDICATIONS IT IS NOT PERMISSIBLE TO OPERATE A MOTOR VEHICLE (MOTORCYCLE/CAR/TRUCK/MOPED) OR HEAVY MACHINERY DO NOT MIX NARCOTICS WITH OTHER CNS (CENTRAL NERVOUS SYSTEM) DEPRESSANTS SUCH AS ALCOHOL   STOP SMOKING OR USING NICOTINE PRODUCTS!!!!  As discussed nicotine severely impairs your body's ability to heal surgical and traumatic wounds but also impairs bone healing.  Wounds and bone heal by forming microscopic blood vessels (angiogenesis) and  nicotine is a vasoconstrictor (essentially, shrinks blood vessels).  Therefore, if vasoconstriction occurs to these microscopic blood vessels they essentially disappear and are unable to deliver necessary nutrients to the healing tissue.  This is one modifiable factor that you can do to dramatically increase your chances of healing your injury.    (This means no smoking, no nicotine gum, patches, etc)  DO NOT USE NONSTEROIDAL ANTI-INFLAMMATORY DRUGS (NSAID'S)  Using products such as Advil (ibuprofen), Aleve (naproxen), Motrin (ibuprofen) for additional pain control during fracture healing can delay and/or prevent the healing response.  If you would like to take over the counter (OTC) medication, Tylenol (acetaminophen) is ok.  However, some narcotic medications that are given for pain control contain acetaminophen as well. Therefore, you should not exceed more than 4000 mg of tylenol in a day if you do not have liver disease.  Also note that there are may OTC medicines, such as cold medicines and allergy medicines that my contain tylenol as well.  If you have any questions about medications and/or interactions please ask your doctor/PA or your pharmacist.      ICE AND ELEVATE INJURED/OPERATIVE EXTREMITY  Using ice and elevating the injured extremity above your heart can help with swelling and pain control.  Icing in a pulsatile fashion, such as 20 minutes on and 20 minutes off, can be followed.    Do not place ice directly on skin. Make sure there is a barrier between to skin and the ice pack.    Using frozen items such as frozen peas works well as the conform nicely to the are that needs to be iced.  USE AN ACE WRAP OR TED HOSE FOR SWELLING CONTROL  In addition to icing and elevation, Ace wraps or TED hose are used to help limit and resolve swelling.  It is recommended to use Ace wraps or TED hose until you are informed to stop.    When using Ace Wraps start the wrapping distally (farthest away from  the body) and wrap proximally (closer to the body)   Example: If you had surgery on your leg or thing and you do not have a splint on, start the ace wrap at the toes and work your way up to the thigh        If you had surgery on your upper extremity and do not have a splint on, start the ace wrap at your fingers and work your way up to the upper arm  IF YOU ARE IN A SPLINT OR CAST DO NOT [place]   If your splint gets wet for any reason please contact the office immediately. You may shower in your splint or cast as long as you keep it dry.  This can be done by wrapping in a cast cover or garbage back (or similar)  Do Not stick any thing down your splint or cast such as pencils, money, or hangers to try and scratch yourself with.  If you feel itchy take benadryl as prescribed on the bottle for itching  IF YOU ARE IN A CAM BOOT (BLACK BOOT)  You may remove boot periodically. Perform daily dressing changes as noted below.  Wash the liner of the boot regularly and wear a sock when wearing the boot. It is recommended that you sleep in the boot until told otherwise   CALL THE OFFICE WITH ANY QUESTIONS OR CONCERNS: [phone]   VISIT OUR WEBSITE FOR ADDITIONAL INFORMATION: [email or web address]       Discharge Wound Care Instructions  Do NOT apply any ointments, solutions or lotions to pin sites or surgical wounds.  These prevent needed drainage and even though solutions like hydrogen peroxide kill bacteria, they also damage cells lining the pin sites that help fight infection.  Applying lotions or ointments can keep the wounds moist and can cause them to breakdown and open up as well. This can increase the risk for infection. When in doubt call the office.  Surgical incisions should be dressed daily.  If any drainage is noted, use one layer of adaptic, then gauze, Kerlix, and an ace wrap.  Once the incision is completely dry and without drainage, it may be left open to air out.   Showering may begin 36-48 hours later.  Cleaning gently with soap and water.  Traumatic wounds should be dressed daily as well.    One layer of adaptic, gauze, Kerlix, then ace wrap.  The adaptic can be discontinued once the draining has ceased    If you have a wet to dry dressing: wet the gauze with saline the squeeze as much saline out so the gauze is moist (not soaking wet), place moistened gauze over wound, then place a dry gauze over the moist one, followed by Kerlix wrap, then ace wrap.

## 2019-06-02 NOTE — Op Note (Signed)
Orthopaedic Surgery Operative Note (CSN: [id number] ) Date of Surgery: 06/02/2019  Admit Date: 06/02/2019   Diagnoses: Pre-Op Diagnoses: Right bimalleolar ankle fracture dislocation  Post-Op Diagnosis: Same  Procedures: CPT 27814-Open reduction internal fixation of right bimalleolar ankle fracture  Surgeons : Primary: Shona Needles, MD  Assistant: Patrecia Pace, PA-C  Location: OR 3   Anesthesia:General  Antibiotics: Ancef 2g preop with 1 gm of vancomycin powder placed topically   Tourniquet time: Total Tourniquet Time Documented: Thigh (Right) - 58 minutes Total: Thigh (Right) - 58 minutes  Estimated Blood Loss:5 mL  Complications:None   Specimens:None   Implants: Implant Name Type Inv. Item Serial No. Manufacturer Lot No. LRB No. Used Action  PLATE FIB EVOS 075-GRM 7H R103 - [id number] Plate PLATE FIB EVOS 075-GRM 7H R103  [employer]  Right 1 Implanted  SCREW CORTEX 3.5X13MM - [id number] Screw SCREW CORTEX 3.5X13MM  [employer]  Right 1 Implanted  SCREW CORT EVOS ST T8 2.7X14 - [id number] Screw SCREW CORT EVOS ST T8 2.7X14  [employer]  Right 1 Implanted  SCREW LOCK 2.7X13 [id number] Screw SCREW LOCK 2.7X13  [employer]  Right 2 Implanted  SCREW BONE 2.7X16MM CORTICAL - [id number] Screw SCREW BONE 2.7X16MM CORTICAL  [employer]  Right 1 Implanted  SCREW CORT EVOS ST T8 2.7X15 - [id number] Screw SCREW CORT EVOS ST T8 2.7X15  [employer]  Right 2 Implanted  SCREW CORT EVOS ST 3.5X12 - [id number] Screw SCREW CORT EVOS ST 3.5X12  [employer]  Right 2 Implanted  SCREW CORT ST EVOS 3.5X70 - [id number] Screw SCREW CORT ST EVOS 3.5X70  [employer]  Right 1 Implanted     Indications for Surgery: 64 year old female who had a vasovagal event while returning from her bathroom this morning.  Had a right ankle fracture dislocation.  She presented to the  emergency room where closed reduction was performed.  She had residual subluxation of that ankle.  Due to the displacement and unstable nature of her injury I recommended proceeding to the operating room for open reduction internal fixation versus external fixation of the right ankle.  Risks and benefits were discussed with the patient.  Risks included but not limited to bleeding, infection, malunion, nonunion, hardware failure, hardware irritation, nerve and blood vessel injury, posttraumatic arthritis, ankle stiffness, DVT, even the possibility anesthetic complications.  The patient agreed to proceed with surgery and consent was obtained.  Operative Findings: 1.  Open reduction internal fixation of right bimalleolar ankle fracture dislocation using [employer] EVOS distal fibular locking plate and a 3.5 millimeter screw for the medial malleolus. 2.  External rotation stress view after fixation showed no medial clear space widening.  Procedure: The patient was identified in the preoperative holding area. Consent was confirmed with the patient and their family and all questions were answered. The operative extremity was marked after confirmation with the patient. she was then brought back to the operating room by our anesthesia colleagues.  She was placed under general anesthetic and carefully transferred over to a radiolucent flat top table.  A nonsterile tourniquet was placed to her upper thigh.  A bump was placed under her operative hip.  The right lower extremity was prepped and draped in usual sterile fashion.  A timeout was performed to verify the patient, the procedure, and the extremity.  Preoperative antibiotics were dosed.  Her swelling was appropriate for surgical  incision.  Fluoroscopy was obtained to show the unstable nature of her injury.  The tourniquet was inflated to 300 mmHg.  Total tourniquet time as noted above.  A direct lateral approach was carried through the skin and subcutaneous  tissue.  I took care to protect the superficial peroneal nerve proximally.  I identified the fracture and perform subperiosteal dissection with a 15 blade.  I cleaned out the hematoma from the fracture site.  Reduction maneuver was performed for the oblique fibular shaft fracture.  Reduction tenaculums were used to hold the reduction provisionally.  K wires I reinforced the reduction.  I confirmed anatomic reduction using fluoroscopy.  Once I had anatomic reduction I then placed a [place] 3.5/2.43mm distal fibular locking plate from the [place] set.  I positioned it appropriately on the distal fibula and placed a 3.5 mm nonlocking screw into the fibular shaft.  The plate was flush to bone distally and I proceeded to place a 2.7 mm locking screws in the distal segment.  I returned to the fibular shaft and placed 3.5 mm nonlocking screws to complete the construct.  I then made an incision medially and carried it down through skin and subcutaneous tissue.  I carefully protected the saphenous neurovascular bundle.  There was significant comminution along the posterior medial malleolus.  There was a good cortical read anteriorly.  I used a reduction tenaculum to anatomically reduce the anterior cortex of the medial malleolus and I held it provisionally with a 1.6 mm K wire.  A 2.5 mm drill bit was used to drill bicortically across the medial malleolus and into the distal tibia.  A 3.5 millimeter screw was then placed.  An attempt was made to place a 3.5 millimeter screw just posterior to the first 1 however the comminution prevented adequate fixation.  As result I felt that this would potentially loosen and may cause an issue and I had good enough fixation with a single screw.  An external rotation stress view was then obtained which showed no medial clear space widening.  I felt that no syndesmotic fixation was warranted.  Final fluoroscopic images were obtained.  The incisions were copiously irrigated.  A gram  of vancomycin powder was placed between the 2 incisions.  A layered closure consisting of 2-0 Vicryl and 3-0 nylon was performed.  A sterile dressing consisting of Mepitel, 4 x 4's, sterile cast padding and well-padded short leg splint was placed.  The patient was awoken from anesthesia and taken to the PACU in stable condition.  Post Op Plan/Instructions: Patient will be nonweightbearing to the right lower extremity.  She will receive aspirin 325 mg twice daily for DVT prophylaxis.  She will be discharged home from the PACU.  She will return in 2 weeks for suture removal and x-rays.  I was present and performed the entire surgery.  Patrecia Pace, PA-C did assist me throughout the case. An assistant was necessary given the difficulty in approach, maintenance of reduction and ability to instrument the fracture.   Katha Hamming, MD [facility]

## 2019-06-02 NOTE — Discharge Summary (Signed)
Orthopaedic Trauma Service (OTS) Discharge Summary   Patient ID: Stacy Park MRN: [id number] DOB/AGE: 10-16-1955 64 y.o.  Admit date: 06/02/2019 Discharge date: 06/02/2019  Admission Diagnoses: Right bimalleolar ankle fracture dislocation  Discharge Diagnoses:  Principal Problem:   Fracture dislocation of right ankle   Past Medical History:  Diagnosis Date  . Breast cancer ([place])    S/P chemoradiation  . Cellulitis    Rt Ankle  . Hypothyroidism   . Mechanical complication of other vascular device, implant, and graft   . Plantar fasciitis   . Tenosynovitis of ankle      Procedures Performed: CPT 27814-Open reduction internal fixation of right bimalleolar ankle fracture  Discharged Condition: good  Hospital Course: Patient presented to [facility] emergency department on the morning of 06/02/2019 after sustaining a fall at home.  Was found to have a right bimalleolar ankle fracture dislocation.  Fracture was reduced in the emergency department and patient was placed in a short leg splint.  Was taken to the operating room by Dr. Doreatha Martin on 06/02/2019 for the above procedure and tolerated this well without complications. After adequate recovery in the PACU on the afternoon of 06/02/2019, the patient's pain was well controlled, vital signs stable, dressings clean, dry, intact and felt stable for discharge to home.  Patient will follow up as below and knows to call with questions or concerns.     Consults: None  Significant Diagnostic Studies:   Results for orders placed or performed during the hospital encounter of 06/02/19 (from the past 168 hour(s))  CBC with Differential   Collection Time: 06/02/19  4:27 AM  Result Value Ref Range   WBC 5.9 4.0 - 10.5 K/uL   RBC 4.04 3.87 - 5.11 MIL/uL   Hemoglobin 10.8 (L) 12.0 - 15.0 g/dL   HCT 34.8 (L) 36.0 - 46.0 %   MCV 86.1 80.0 - 100.0 fL   MCH 26.7 26.0 - 34.0 pg   MCHC 31.0 30.0 - 36.0 g/dL   RDW 20.2 (H)  11.5 - 15.5 %   Platelets 152 150 - 400 K/uL   nRBC 0.0 0.0 - 0.2 %   Neutrophils Relative % 90 %   Neutro Abs 5.4 1.7 - 7.7 K/uL   Lymphocytes Relative 4 %   Lymphs Abs 0.2 (L) 0.7 - 4.0 K/uL   Monocytes Relative 2 %   Monocytes Absolute 0.1 0.1 - 1.0 K/uL   Eosinophils Relative 2 %   Eosinophils Absolute 0.1 0.0 - 0.5 K/uL   Basophils Relative 1 %   Basophils Absolute 0.0 0.0 - 0.1 K/uL   Immature Granulocytes 1 %   Abs Immature Granulocytes 0.04 0.00 - 0.07 K/uL  Comprehensive metabolic panel   Collection Time: 06/02/19  4:27 AM  Result Value Ref Range   Sodium 136 135 - 145 mmol/L   Potassium 3.8 3.5 - 5.1 mmol/L   Chloride 101 98 - 111 mmol/L   CO2 25 22 - 32 mmol/L   Glucose, Bld 99 70 - 99 mg/dL   BUN 18 8 - 23 mg/dL   Creatinine, Ser 1.21 (H) 0.44 - 1.00 mg/dL   Calcium 9.0 8.9 - 10.3 mg/dL   Total Protein 6.1 (L) 6.5 - 8.1 g/dL   Albumin 3.4 (L) 3.5 - 5.0 g/dL   AST 21 15 - 41 U/L   ALT 29 0 - 44 U/L   Alkaline Phosphatase 40 38 - 126 U/L   Total Bilirubin 0.8 0.3 - 1.2 mg/dL  GFR calc non Af Amer 47 (L) >60 mL/min   GFR calc Af Amer 55 (L) >60 mL/min   Anion gap 10 5 - 15  Respiratory Panel by RT PCR (Flu A&B, Covid) - Nasopharyngeal Swab   Collection Time: 06/02/19  6:11 AM   Specimen: Nasopharyngeal Swab  Result Value Ref Range   SARS Coronavirus 2 by RT PCR NEGATIVE NEGATIVE   Influenza A by PCR NEGATIVE NEGATIVE   Influenza B by PCR NEGATIVE NEGATIVE  CBC   Collection Time: 06/02/19  8:41 AM  Result Value Ref Range   WBC 5.6 4.0 - 10.5 K/uL   RBC 3.92 3.87 - 5.11 MIL/uL   Hemoglobin 10.7 (L) 12.0 - 15.0 g/dL   HCT 34.3 (L) 36.0 - 46.0 %   MCV 87.5 80.0 - 100.0 fL   MCH 27.3 26.0 - 34.0 pg   MCHC 31.2 30.0 - 36.0 g/dL   RDW 20.3 (H) 11.5 - 15.5 %   Platelets 165 150 - 400 K/uL   nRBC 0.0 0.0 - 0.2 %  No blood products   Collection Time: 06/02/19 12:28 PM  Result Value Ref Range   Transfuse no blood products      TRANSFUSE NO BLOOD PRODUCTS,  VERIFIED BY LEIGH ANN HUDSON RN Performed at [facility], [place] [address], [place], [place] [address]      Treatments: IV hydration, antibiotics: Ancef, analgesia: acetaminophen and Vicodin and surgery: CPT 27814-Open reduction internal fixation of right bimalleolar ankle fracture  Discharge Exam: General: Sitting up in bed, NAD Respiratory: No increased work of breathing at rest RLE: Short leg splint in place. Non-tender above splint. Able to wiggle toes. Sensation intact distally. Extremity warm, brisk cap refill.    Disposition: Discharge disposition: 01-Home or Self Care       Discharge Instructions    Call MD / Call 911   Complete by: As directed    If you experience chest pain or shortness of breath, CALL 911 and be transported to the hospital emergency room.  If you develope a fever above 101 F, pus (white drainage) or increased drainage or redness at the wound, or calf pain, call your surgeon's office.   Constipation Prevention   Complete by: As directed    Drink plenty of fluids.  Prune juice may be helpful.  You may use a stool softener, such as Colace (over the counter) 100 mg twice a day.  Use MiraLax (over the counter) for constipation as needed.   Diet - low sodium heart healthy   Complete by: As directed    Increase activity slowly as tolerated   Complete by: As directed      Allergies as of 06/02/2019      Reactions   Latex Rash   Per pt "tears skin"   Penicillins Rash      Medication List    TAKE these medications   acetaminophen 500 MG tablet Commonly known as: TYLENOL Take 1,000 mg by mouth every 6 (six) hours as needed for mild pain or headache.   albuterol 108 (90 Base) MCG/ACT inhaler Commonly known as: VENTOLIN HFA Inhale 2 puffs into the lungs every 4 (four) hours as needed for shortness of breath or wheezing.   ascorbic acid 500 MG tablet Commonly known as: VITAMIN C Take 1 tablet by mouth daily.   aspirin EC 325 MG tablet Take 1  tablet (325 mg total) by mouth in the morning and at bedtime.   calcitRIOL 0.25 MCG  capsule Commonly known as: ROCALTROL Take 0.25 mcg by mouth daily.   CALCIUM CARBONATE-VIT D-MIN PO Take 12,000 mg by mouth daily.   cetirizine 10 MG tablet Commonly known as: ZYRTEC Take 10 mg by mouth daily as needed for allergies.   escitalopram 10 MG tablet Commonly known as: LEXAPRO Take 10 mg by mouth daily.   exemestane 25 MG tablet Commonly known as: AROMASIN Take 25 mg by mouth daily.   furosemide 20 MG tablet Commonly known as: LASIX Take 20 mg by mouth daily as needed for fluid or edema.   HYDROcodone-acetaminophen 5-325 MG tablet Commonly known as: NORCO/VICODIN Take 1-2 tablets by mouth every 6 (six) hours as needed for moderate pain.   levothyroxine 50 MCG tablet Commonly known as: SYNTHROID Take 50 mcg by mouth daily.   LORazepam 0.5 MG tablet Commonly known as: ATIVAN Take 0.5 mg by mouth daily as needed for anxiety.   ondansetron 4 MG tablet Commonly known as: ZOFRAN Take 4 mg by mouth every 4 (four) hours as needed for nausea/vomiting.   potassium chloride 10 MEQ tablet Commonly known as: KLOR-CON Take 10 mEq by mouth 2 (two) times daily.   PROBIOTIC-10 PO Take 1 capsule by mouth daily.   prochlorperazine 10 MG tablet Commonly known as: COMPAZINE Take 10 mg by mouth every 6 (six) hours as needed for nausea/vomiting.   traZODone 150 MG tablet Commonly known as: DESYREL Take 150 mg by mouth at bedtime.   zinc gluconate 50 MG tablet Take 1 tablet by mouth daily.      Follow-up Information    Haddix, Thomasene Lot, MD. Schedule an appointment as soon as possible for a visit in 2 weeks.   Specialty: Orthopedic Surgery Why: For suture removal, For wound re-check, For repeat x-rays Contact information: [address] [place] [address] [phone]           Discharge Instructions and Plan: Patient will be discharged to home. Will be discharged on  Aspirin 325 mg twice daily x 30 days for DVT prophylaxis. Patient has been provided with all the necessary DME for discharge. Patient will follow up with Dr. Doreatha Martin in 2 weeks for repeat x-rays and suture removal.   Signed:  Leary Roca. Carmie Kanner ?[phone]? (phone) 06/02/2019, 2:47 PM  [facility] [place] [address] [phone] (F)

## 2019-06-02 NOTE — Anesthesia Preprocedure Evaluation (Addendum)
Anesthesia Evaluation  Patient identified by MRN, date of birth, ID band Patient awake    Reviewed: Allergy & Precautions, NPO status , Patient's Chart, lab work & pertinent test results  Airway Mallampati: II  TM Distance: >3 FB Neck ROM: Full    Dental no notable dental hx.    Pulmonary neg pulmonary ROS,    Pulmonary exam normal breath sounds clear to auscultation       Cardiovascular negative cardio ROS Normal cardiovascular exam Rhythm:Regular Rate:Normal  ECG: rate 76. Sinus rhythm Anteroseptal infarct, age indeterminate   Neuro/Psych PSYCHIATRIC DISORDERS negative neurological ROS     GI/Hepatic negative GI ROS, Neg liver ROS,   Endo/Other  Hypothyroidism Breast cancer S/P chemoradiation   Renal/GU negative Renal ROS     Musculoskeletal negative musculoskeletal ROS (+)   Abdominal   Peds  Hematology  (+) anemia , REFUSES BLOOD PRODUCTS, JEHOVAH'S WITNESS  Anesthesia Other Findings Right ankle fracture dislocation  Reproductive/Obstetrics                           Anesthesia Physical Anesthesia Plan  ASA: II  Anesthesia Plan: General and Regional   Post-op Pain Management: GA combined w/ Regional for post-op pain   Induction: Intravenous  PONV Risk Score and Plan: 3 and Ondansetron, Dexamethasone, Midazolam and Treatment may vary due to age or medical condition  Airway Management Planned:   Additional Equipment:   Intra-op Plan:   Post-operative Plan: Extubation in OR  Informed Consent: I have reviewed the patients History and Physical, chart, labs and discussed the procedure including the risks, benefits and alternatives for the proposed anesthesia with the patient or authorized representative who has indicated his/her understanding and acceptance.     Dental advisory given  Plan Discussed with: CRNA  Anesthesia Plan Comments:       Anesthesia Quick  Evaluation

## 2019-06-02 NOTE — ED Provider Notes (Signed)
[name] EMERGENCY DEPARTMENT Provider Note   CSN: [id number] Arrival date & time: 06/02/19  0413     History Chief Complaint  Patient presents with  . Ankle Injury    Fractured/Dislocated    Stacy Park is a 64 y.o. female.  Patient presents to the emergency department with a chief complaint of right ankle pain.  She states that she got up to use the bathroom this morning at around 2 AM.  After having used bathroom, she was on her way back to her bed when she became lightheaded and passed out.  She states that she is uncertain what happened to her ankle..  She complains of moderate pain in the ankle.  The pain is worsened with palpation and movement.  She denies any numbness or tingling.  Denies having had any chest pain or shortness of breath prior to passing out.  Denies any other associated symptoms.  She is not anticoagulated.  The history is provided by the patient. No language interpreter was used.       Past Medical History:  Diagnosis Date  . Breast cancer ([place])    S/P chemoradiation  . Cellulitis    Rt Ankle  . Hypothyroidism   . Mechanical complication of other vascular device, implant, and graft   . Plantar fasciitis   . Tenosynovitis of ankle     Patient Active Problem List   Diagnosis Date Noted  . Breast cancer ([place])   . Tenosynovitis of ankle   . Cellulitis   . Plantar fasciitis   . Hypothyroidism   . Mechanical complication of other vascular device, implant, and graft     Past Surgical History:  Procedure Laterality Date  . ABDOMINAL HYSTERECTOMY    . centeral IV  age 42years old   with Tunneling and subcutan Port  . CESAREAN SECTION    . MASTECTOMY    . PORT-A-CATH REMOVAL  06/2010   Dr Melynda Ripple  . transcatheter foreign body retrieval  08/20/2010, Dr Dyke Maes   retained catheter fragment in ri heart related to a severed Port A Cath     OB History   No obstetric history on file.     No family  history on file.  Social History   Tobacco Use  . Smoking status: Never Smoker  . Smokeless tobacco: Never Used  Substance Use Topics  . Alcohol use: Never  . Drug use: Never    Home Medications Prior to Admission medications   Medication Sig Start Date End Date Taking? Authorizing Provider  alendronate (FOSAMAX) 70 MG tablet Take 70 mg by mouth every 7 (seven) days. Take with a full glass of water on an empty stomach.     [provider]  anastrozole (ARIMIDEX) 1 MG tablet Take 1 mg by mouth daily.      [provider]  levothyroxine (SYNTHROID, LEVOTHROID) 50 MCG tablet Take 50 mcg by mouth daily.      [provider]    Allergies    Latex and Penicillins  Review of Systems   Review of Systems  All other systems reviewed and are negative.   Physical Exam Updated Vital Signs BP (!) 100/56 (BP Location: Left Arm)   Pulse 70   Temp 98.5 F (36.9 C) (Oral)   Resp 14   Ht 5\' 6"  (1.676 m)   Wt 70 kg   SpO2 98%   BMI 24.91 kg/m   Physical Exam Vitals and nursing note reviewed.  Constitutional:      General: She is not in acute distress.    Appearance: She is well-developed.  HENT:     Head: Normocephalic and atraumatic.  Eyes:     Conjunctiva/sclera: Conjunctivae normal.  Cardiovascular:     Rate and Rhythm: Normal rate and regular rhythm.     Pulses: Normal pulses.     Heart sounds: No murmur.     Comments: Intact dorsalis pedis pulse in right foot Pulmonary:     Effort: Pulmonary effort is normal. No respiratory distress.     Breath sounds: Normal breath sounds.  Abdominal:     Palpations: Abdomen is soft.     Tenderness: There is no abdominal tenderness.  Musculoskeletal:     Cervical back: Neck supple.     Comments: Significant deformity of the right ankle, severe tenting of the skin over the medial malleolus  Skin:    General: Skin is warm and dry.  Neurological:     Mental Status: She is alert.       ED Results /  Procedures / Treatments   Labs (all labs ordered are listed, but only abnormal results are displayed) Labs Reviewed  CBC WITH DIFFERENTIAL/PLATELET - Abnormal; Notable for the following components:      Result Value   Hemoglobin 10.8 (*)    HCT 34.8 (*)    RDW 20.2 (*)    Lymphs Abs 0.2 (*)    All other components within normal limits  COMPREHENSIVE METABOLIC PANEL - Abnormal; Notable for the following components:   Creatinine, Ser 1.21 (*)    Total Protein 6.1 (*)    Albumin 3.4 (*)    GFR calc non Af Amer 47 (*)    GFR calc Af Amer 55 (*)    All other components within normal limits    EKG EKG Interpretation  Date/Time:  Thursday June 02 2019 06:04:20 EDT Ventricular Rate:  76 PR Interval:    QRS Duration: 93 QT Interval:  406 QTC Calculation: 457 R Axis:   106 Text Interpretation: Sinus rhythm Anteroseptal infarct, age indeterminate NO STEMI. No old tracing to compare Confirmed by Addison Lank [phone]) on 06/02/2019 6:11:11 AM   Radiology DG Ankle Complete Right  Result Date: 06/02/2019 CLINICAL DATA:  Reduction of ankle dislocation. EXAM: RIGHT ANKLE - COMPLETE 3+ VIEW COMPARISON:  Earlier films, same date. FINDINGS: Interval reduction of the tibiotalar dislocation. Mild persistent lateral subluxation of the talus in relation to the tibia. Much improved position and alignment of the oblique coursing distal fibular shaft fracture. IMPRESSION: 1. Interval reduction of tibiotalar dislocation with mild persistent lateral subluxation of the talus in relation to the tibia. 2. Much improved position and alignment of the distal fibular shaft fracture. Electronically Signed   By: Marijo Sanes M.D.   On: 06/02/2019 06:00   DG Ankle Complete Right  Result Date: 06/02/2019 CLINICAL DATA:  [place] this morning and injured ankle. EXAM: RIGHT ANKLE - COMPLETE 3+ VIEW COMPARISON:  None. FINDINGS: Complex ankle fracture dislocation. The talus is dislocated laterally. Associated comminuted  and displaced fracture of the distal fibular shaft at and above the level of the ankle mortise. There is also a transverse fracture through the base of the medial malleolus at the level of the ankle mortise. This is still attached to the talus the of the deltoid ligament but markedly displaced from the tibia. The talus is intact and the subtalar joints are maintained. No mid or hindfoot fractures are identified. IMPRESSION: Complex  ankle fracture dislocation. Electronically Signed   By: Marijo Sanes M.D.   On: 06/02/2019 04:59    Procedures .Ortho Injury Treatment  Date/Time: 06/02/2019 6:12 AM Performed by: Montine Circle, PA-C Authorized by: Montine Circle, PA-C   Consent:    Consent obtained:  Verbal and emergent situation   Consent given by:  PatientInjury location: ankle Location details: right ankle Injury type: fracture-dislocation Fracture type: bimalleolar Pre-procedure neurovascular assessment: neurovascularly intact Pre-procedure distal perfusion: normal Pre-procedure neurological function: normal Pre-procedure range of motion comment: deferred Anesthesia: local infiltration  Anesthesia: Local anesthesia used: yes Local Anesthetic: lidocaine 1% with epinephrine Anesthetic total: 10 mL  Patient sedated: NoManipulation performed: yes Skeletal traction used: yes Reduction successful: yes X-ray confirmed reduction: yes Immobilization: splint Splint type: ankle stirrup Supplies used: cotton padding,  Ortho-Glass and elastic bandage Post-procedure neurovascular assessment: post-procedure neurovascularly intact Post-procedure distal perfusion: normal Post-procedure neurological function: normal Post-procedure range of motion comment: deferred Patient tolerance: patient tolerated the procedure well with no immediate complications    (including critical care time)  Medications Ordered in ED Medications  lidocaine-EPINEPHrine (XYLOCAINE W/EPI) 2 %-1:200000 (PF)  injection 20 mL (has no administration in time range)  fentaNYL (SUBLIMAZE) injection 50 mcg (50 mcg Intravenous Given 06/02/19 0524)    ED Course  I have reviewed the triage vital signs and the nursing notes.  Pertinent labs & imaging results that were available during my care of the patient were reviewed by me and considered in my medical decision making (see chart for details).    MDM Rules/Calculators/A&P                      This patient complains of right ankle pain, this involves an extensive number of treatment options, and is a complaint that carries with it a high risk of complications and morbidity.  The differential diagnosis includes fracture, dislocation, ischemia due to trauma.  I ordered, reviewed, and interpreted labs, which included CBC is notable for mild anemia, hemoglobin is 10.8.  CMP is notable for mildly elevated creatinine at 1.21, will give fluids.  Blood pressure is 100/56.  Suspect that the patient either vasovagal or has some dehydration causing her to have low blood pressure which caused her to pass out.  EKG shows no concerning dysrhythmias or ischemic changes.  Patient denies any chest pain or shortness of breath.  She denies any symptoms currently. I ordered medication fentanyl for pain and lidocaine for hematoma block. I ordered imaging studies which included right ankle and I independently visualized and interpreted imaging which showed fracture dislocation.  Given the severe tenting of the skin over the right medial malleolus, emergent reduction was performed by me with Dr. Leonette Monarch present in the room.  The reduction was successful, patient had intact distal pulses of the right dorsalis pedis and right posterior tibialis following the reduction.  She had intact sensation.  The pressure was removed from the skin around the medial malleolus.  There remains some traumatic changes to the skin, but there was no puncture.  Patient was placed in a posterior and stirrup  with extra padding over the medial malleolus.  Pain is well controlled.  I consulted Dr. Doreatha Martin, from orthopedics and discussed pertinent lab and imaging findings.  Concern for necrosis and skin breakdown over the medial malleolus given the amount of time that there was severe tenting.  Current plan is to take the patient to the OR.  Patient will need Covid test.  Dr. Doreatha Martin to  evaluate the patient.  States he may request hospitalist consult versus admission, but he will see the patient first.   Anticipate admission and to the OR.  Oncoming team notified of the plan.  Final Clinical Impression(s) / ED Diagnoses Final diagnoses:  Dislocation of right ankle joint, initial encounter  Closed fracture of right ankle, initial encounter    Rx / DC Orders ED Discharge Orders    None       Montine Circle, PA-C 06/02/19 0617    Fatima Blank, MD 06/04/19 2306

## 2019-06-02 NOTE — Transfer of Care (Signed)
Immediate Anesthesia Transfer of Care Note  Patient: Stacy Park  Procedure(s) Performed: OPEN REDUCTION INTERNAL FIXATION RIGHT ANKLE FRACTURE (Right Ankle)  Patient Location: PACU  Anesthesia Type:GA combined with regional for post-op pain  Level of Consciousness: drowsy  Airway & Oxygen Therapy: Patient Spontanous Breathing and Patient connected to face mask oxygen  Post-op Assessment: Report given to RN and Post -op Vital signs reviewed and stable  Post vital signs: Reviewed and stable  Last Vitals:  Vitals Value Taken Time  BP 112/63   Temp    Pulse 88   Resp 16   SpO2 99     Last Pain:  Vitals:   06/02/19 1230  TempSrc:   PainSc: 0-No pain         Complications: No apparent anesthesia complications

## 2019-06-02 NOTE — Anesthesia Procedure Notes (Signed)
Anesthesia Regional Block: Popliteal block   Pre-Anesthetic Checklist: ,, timeout performed, Correct Patient, Correct Site, Correct Laterality, Correct Procedure,, site marked, risks and benefits discussed, Surgical consent,  Pre-op evaluation,  At surgeon's request and post-op pain management  Laterality: Right  Prep: chloraprep       Needles:  Injection technique: Single-shot  Needle Type: Echogenic Stimulator Needle     Needle Length: 9cm  Needle Gauge: 21     Additional Needles:   Procedures:, nerve stimulator,,, ultrasound used (permanent image in chart), intact distal pulses,,,  Narrative:  Start time: 06/02/2019 12:20 PM End time: 06/02/2019 12:30 PM Injection made incrementally with aspirations every 5 mL.  Performed by: Personally  Anesthesiologist: Murvin Natal, MD  Additional Notes: Functioning IV was confirmed and monitors were applied.  A 78mm 21ga Arrow echogenic stimulator needle was used. Sterile prep and drape,hand hygiene and sterile gloves were used.  Negative aspiration and negative test dose prior to incremental administration of local anesthetic. The patient tolerated the procedure well.

## 2019-06-02 NOTE — Anesthesia Procedure Notes (Signed)
Procedure Name: Intubation Date/Time: 06/02/2019 12:53 PM Performed by: Candis Shine, CRNA Pre-anesthesia Checklist: Patient identified, Emergency Drugs available, Suction available and Patient being monitored Patient Re-evaluated:Patient Re-evaluated prior to induction Oxygen Delivery Method: Circle System Utilized Preoxygenation: Pre-oxygenation with 100% oxygen Induction Type: IV induction Ventilation: Mask ventilation without difficulty Laryngoscope Size: Mac and 3 Grade View: Grade I Tube type: Oral Tube size: 7.0 mm Number of attempts: 1 Airway Equipment and Method: Stylet Placement Confirmation: ETT inserted through vocal cords under direct vision,  positive ETCO2 and breath sounds checked- equal and bilateral Secured at: 22 cm Tube secured with: Tape Dental Injury: Teeth and Oropharynx as per pre-operative assessment  Comments: Attempted LMA 4 placement, unable to adequately seat.

## 2019-06-02 NOTE — H&P (View-Only) (Signed)
Orthopaedic Trauma Service (OTS) Consult  Patient ID: Stacy Park MRN: [id number] DOB/AGE: December 29, 1955 64 y.o.  Reason for Consult: Right ankle fracture/dislocation Referring Physician: Montine Circle, PA-C [facility] ED)  HPI: Stacy Park is an 64 y.o. female being seen in consultation at the request of PA Marlon Pel for right ankle fracture/dislocation.  Patient got up to use the bathroom this morning around 2 AM and when heading back to bed felt lightheaded and passed out.  Was brought to the emergency room for evaluation and found to have a right ankle fracture dislocation.  Orthopedic trauma service was consulted, recommended fracture reduction by ED provider and subsequent splinting of the ankle.  Successful reduction of the ankle performed by ED provider, confirmed with postreduction x-rays.  Patient seen this morning in the emergency department.  Pain is currently well controlled.  Denies any significant numbness or tingling.  Patient unsure why she may have passed out.  Denies any chest pain or shortness of breath leading up to the event.  Denies any other associated symptoms at the time.  Patient states she feels fine now.  She does have a history of breast cancer which she was treated for in the past.  Also notes that she is actively being treated for breast cancer with chemo/radiation. She is not currently on anticoagulation/  Past Medical History:  Diagnosis Date  . Breast cancer ([place])    S/P chemoradiation  . Cellulitis    Rt Ankle  . Hypothyroidism   . Mechanical complication of other vascular device, implant, and graft   . Plantar fasciitis   . Tenosynovitis of ankle     Past Surgical History:  Procedure Laterality Date  . ABDOMINAL HYSTERECTOMY    . centeral IV  age 73years old   with Tunneling and subcutan Port  . CESAREAN SECTION    . MASTECTOMY    . PORT-A-CATH REMOVAL  06/2010   Dr Melynda Ripple  . transcatheter foreign body  retrieval  08/20/2010, Dr Dyke Maes   retained catheter fragment in ri heart related to a severed Port A Cath    No family history on file.  Social History:  reports that she has never smoked. She has never used smokeless tobacco. She reports that she does not drink alcohol or use drugs.  Allergies:  Allergies  Allergen Reactions  . Latex Rash    Per pt "tears skin"  . Penicillins Rash    Medications: I have reviewed the patient's current medications.  ROS: Constitutional: No fever or chills Vision: No changes in vision ENT: No difficulty swallowing CV: No chest pain Pulm: No SOB or wheezing GI: No nausea or vomiting GU: No urgency or inability to hold urine Skin: No poor wound healing Neurologic: No numbness or tingling Psychiatric: No depression or anxiety Heme: No bruising Allergic: No reaction to medications or food   Exam: Blood pressure (!) 99/58, pulse 70, temperature 98.5 F (36.9 C), temperature source Oral, resp. rate 14, height 5\' 6"  (1.676 m), weight 70 kg, SpO2 97 %. General: Sitting up in bed, NAD Orientation: Alert and oriented x 3 Mood and Affect: Mood and affect appropriate. Pleasant and cooperative Gait: Not assessed due to known fracture Coordination and balance: Within normal limits  Right Lower extremity: Short leg splint in place. Non-tender above splint. No significant swelling through forefoot or toes. Able to wiggles. Extremity warm. 2+ DP pulse  Left Lower Extremity: Skin without lesions. No tenderness to palpation. Full painless ROM, full  strength in each muscle groups without evidence of instability.   Medical Decision Making: Data: Imaging: AP and lateral views of the right ankle show fracture through both the distal fibula as well as the medial malleolus.   Labs:  Results for orders placed or performed during the hospital encounter of 06/02/19 (from the past 24 hour(s))  CBC with Differential     Status: Abnormal   Collection Time:  06/02/19  4:27 AM  Result Value Ref Range   WBC 5.9 4.0 - 10.5 K/uL   RBC 4.04 3.87 - 5.11 MIL/uL   Hemoglobin 10.8 (L) 12.0 - 15.0 g/dL   HCT 34.8 (L) 36.0 - 46.0 %   MCV 86.1 80.0 - 100.0 fL   MCH 26.7 26.0 - 34.0 pg   MCHC 31.0 30.0 - 36.0 g/dL   RDW 20.2 (H) 11.5 - 15.5 %   Platelets 152 150 - 400 K/uL   nRBC 0.0 0.0 - 0.2 %   Neutrophils Relative % 90 %   Neutro Abs 5.4 1.7 - 7.7 K/uL   Lymphocytes Relative 4 %   Lymphs Abs 0.2 (L) 0.7 - 4.0 K/uL   Monocytes Relative 2 %   Monocytes Absolute 0.1 0.1 - 1.0 K/uL   Eosinophils Relative 2 %   Eosinophils Absolute 0.1 0.0 - 0.5 K/uL   Basophils Relative 1 %   Basophils Absolute 0.0 0.0 - 0.1 K/uL   Immature Granulocytes 1 %   Abs Immature Granulocytes 0.04 0.00 - 0.07 K/uL  Comprehensive metabolic panel     Status: Abnormal   Collection Time: 06/02/19  4:27 AM  Result Value Ref Range   Sodium 136 135 - 145 mmol/L   Potassium 3.8 3.5 - 5.1 mmol/L   Chloride 101 98 - 111 mmol/L   CO2 25 22 - 32 mmol/L   Glucose, Bld 99 70 - 99 mg/dL   BUN 18 8 - 23 mg/dL   Creatinine, Ser 1.21 (H) 0.44 - 1.00 mg/dL   Calcium 9.0 8.9 - 10.3 mg/dL   Total Protein 6.1 (L) 6.5 - 8.1 g/dL   Albumin 3.4 (L) 3.5 - 5.0 g/dL   AST 21 15 - 41 U/L   ALT 29 0 - 44 U/L   Alkaline Phosphatase 40 38 - 126 U/L   Total Bilirubin 0.8 0.3 - 1.2 mg/dL   GFR calc non Af Amer 47 (L) >60 mL/min   GFR calc Af Amer 55 (L) >60 mL/min   Anion gap 10 5 - 15  Respiratory Panel by RT PCR (Flu A&B, Covid) - Nasopharyngeal Swab     Status: None   Collection Time: 06/02/19  6:11 AM   Specimen: Nasopharyngeal Swab  Result Value Ref Range   SARS Coronavirus 2 by RT PCR NEGATIVE NEGATIVE   Influenza A by PCR NEGATIVE NEGATIVE   Influenza B by PCR NEGATIVE NEGATIVE     Assessment/Plan: 64 year old female with right ankle fracture dislocation.  Patient has sustained a significant injury to the right lower extremity, requiring surgical fixation.  Recommend proceeding  to the operating room today.  We will plan for open reduction internal fixation of the right ankle.  If soft tissue swelling appears to be too significant we will plan to proceed with external fixation of the right ankle to allow the soft tissues time to rest.  The risk and benefits of the procedure were discussed with the patient. Risks discussed included bleeding, infection, malunion, nonunion, damage to surrounding nerves and blood vessels, pain, hardware  prominence or irritation, hardware failure, stiffness, post-traumatic arthritis, DVT/PE, compartment syndrome, and complications associated with anesthesia.  Patient states understanding of these risks and agrees to proceed with surgery.  All questions answered.  Consent will be obtained.    Casy Tavano A. Carmie [facility] [phone] (office) [email or web address]

## 2019-06-02 NOTE — Progress Notes (Signed)
Orthopedic Tech Progress Note Patient Details:  Stacy Park 1955/11/02 [id number] PACU RN called requesting for a PAIR OF CRUTCHES. Patient did have a little trouble with CRUTCHES. Made the suggestion she rents a KNEE SCOOTER if possible. Ortho Devices Type of Ortho Device: Crutches Ortho Device/Splint Location: rle. applied post reduction with drs assist. Ortho Device/Splint Interventions: Adjustment   Post Interventions Patient Tolerated: Difficulty with ambulation Instructions Provided: [facility] of [place] 06/02/2019, 4:49 PM

## 2019-06-02 NOTE — Anesthesia Procedure Notes (Signed)
Anesthesia Regional Block: Adductor canal block   Pre-Anesthetic Checklist: ,, timeout performed, Correct Patient, Correct Site, Correct Laterality, Correct Procedure,, site marked, risks and benefits discussed, Surgical consent,  Pre-op evaluation,  At surgeon's request and post-op pain management  Laterality: Right  Prep: chloraprep       Needles:  Injection technique: Single-shot  Needle Type: Echogenic Stimulator Needle     Needle Length: 9cm  Needle Gauge: 21     Additional Needles:   Procedures:,,,, ultrasound used (permanent image in chart),,,,  Narrative:  Start time: 06/02/2019 12:30 PM End time: 06/02/2019 12:40 PM Injection made incrementally with aspirations every 5 mL.  Performed by: Personally  Anesthesiologist: Murvin Natal, MD  Additional Notes: Functioning IV was confirmed and monitors were applied. A time-out was performed. Hand hygiene and sterile gloves were used. The thigh was placed in a frog-leg position and prepped in a sterile fashion. A 74mm 21ga Arrow echogenic stimulator needle was placed using ultrasound guidance.  Negative aspiration and negative test dose prior to incremental administration of local anesthetic. The patient tolerated the procedure well.

## 2019-06-02 NOTE — ED Triage Notes (Signed)
Patient arrived with EMS from home lost her balance and fell this morning , no LOC ,alert and oriented/respirations unlabored , presents with right ankle swelling/deformity , skin intact, she received fentanyl 100 mcg IV by EMS with relief.

## 2019-06-04 NOTE — ED Provider Notes (Signed)
Attestation: Medical screening examination/treatment/procedure(s) were conducted as a shared visit with non-physician practitioner(s) and myself.  I personally evaluated the patient during the encounter.   Briefly, the patient is a 64 y.o. female here with right ankle fracture/dislocation  Vitals:   06/02/19 1613 06/02/19 1615  BP: (!) 109/55   Pulse: 75   Resp: 13   Temp:  97.8 F (36.6 C)  SpO2: 96%     CONSTITUTIONAL:  well-appearing, NAD NEURO:  Alert and oriented x 3, no focal deficits EYES:  pupils equal and reactive ENT/NECK:  trachea midline, no JVD CARDIO:  reg rate, reg rhythm, well-perfused PULM:  nonlabored breathing GI/GU:  Abdomin non-distended MSK/SPINE:  Severely everted right foot and deformity at right ankle. No proximal lower leg tenderness. SKIN:  Skin tenting to medial malleolus with skin darkening. No puncture. PSYCH:  Appropriate speech and behavior   EKG Interpretation  Date/Time:  Thursday June 02 2019 06:04:20 EDT Ventricular Rate:  76 PR Interval:    QRS Duration: 93 QT Interval:  406 QTC Calculation: 457 R Axis:   106 Text Interpretation: Sinus rhythm Anteroseptal infarct, age indeterminate NO STEMI. No old tracing to compare Confirmed by Addison Lank [phone]) on 06/02/2019 6:11:11 AM Also confirmed by Addison Lank [phone]), editor Hattie Perch ([id number])  on 06/03/2019 9:07:28 AM      Right ankle fracture/dislocation. Intact pulses. Skin necrosis. Hematoma block and reduction by APP. I was present for the procedure to assist.  .Splint Application  Date/Time: 06/04/2019 11:05 PM Performed by: Fatima Blank, MD Authorized by: Fatima Blank, MD   Consent:    Consent obtained:  Verbal   Consent given by:  Patient   Alternatives discussed:  Delayed treatment Procedure details:    Laterality:  Right   Location:  Ankle   Ankle:  R ankle   Splint type: Cadillac splint.   Supplies:  [facility] Post-procedure details:    Pain:  Improved   Patient tolerance of procedure:  Tolerated well, no immediate complications     Admitted to Ortho for surgery.    Fatima Blank, MD 06/04/19 2306

## 2019-06-05 ENCOUNTER — Other Ambulatory Visit: Payer: Self-pay

## 2019-06-05 ENCOUNTER — Emergency Department (HOSPITAL_COMMUNITY): Payer: BC Managed Care – PPO

## 2019-06-05 ENCOUNTER — Emergency Department (HOSPITAL_COMMUNITY)
Admission: EM | Admit: 2019-06-05 | Discharge: 2019-06-05 | Disposition: A | Discharge status: Home / Self Care | Payer: BC Managed Care – PPO | Attending: Emergency Medicine | Admitting: Emergency Medicine

## 2019-06-05 ENCOUNTER — Encounter (HOSPITAL_COMMUNITY): Payer: Self-pay | Admitting: Emergency Medicine

## 2019-06-05 DIAGNOSIS — Z79899 Other long term (current) drug therapy: Secondary | ICD-10-CM | POA: Diagnosis not present

## 2019-06-05 DIAGNOSIS — R1110 Vomiting, unspecified: Secondary | ICD-10-CM | POA: Diagnosis not present

## 2019-06-05 DIAGNOSIS — T40605A Adverse effect of unspecified narcotics, initial encounter: Secondary | ICD-10-CM | POA: Diagnosis not present

## 2019-06-05 DIAGNOSIS — E039 Hypothyroidism, unspecified: Secondary | ICD-10-CM | POA: Insufficient documentation

## 2019-06-05 DIAGNOSIS — R1084 Generalized abdominal pain: Secondary | ICD-10-CM | POA: Diagnosis not present

## 2019-06-05 DIAGNOSIS — Z9889 Other specified postprocedural states: Secondary | ICD-10-CM | POA: Diagnosis not present

## 2019-06-05 DIAGNOSIS — R109 Unspecified abdominal pain: Secondary | ICD-10-CM | POA: Diagnosis not present

## 2019-06-05 DIAGNOSIS — R1030 Lower abdominal pain, unspecified: Secondary | ICD-10-CM | POA: Diagnosis not present

## 2019-06-05 DIAGNOSIS — K5903 Drug induced constipation: Secondary | ICD-10-CM | POA: Diagnosis not present

## 2019-06-05 DIAGNOSIS — K5900 Constipation, unspecified: Secondary | ICD-10-CM | POA: Diagnosis not present

## 2019-06-05 DIAGNOSIS — T391X5A Adverse effect of 4-Aminophenol derivatives, initial encounter: Secondary | ICD-10-CM | POA: Diagnosis not present

## 2019-06-05 DIAGNOSIS — Z853 Personal history of malignant neoplasm of breast: Secondary | ICD-10-CM | POA: Diagnosis not present

## 2019-06-05 LAB — URINALYSIS, ROUTINE W REFLEX MICROSCOPIC
Bacteria, UA: NONE SEEN
Bilirubin Urine: NEGATIVE
Glucose, UA: NEGATIVE mg/dL
Ketones, ur: 5 mg/dL — AB
Leukocytes,Ua: NEGATIVE
Nitrite: NEGATIVE
Protein, ur: NEGATIVE mg/dL
Specific Gravity, Urine: 1.011 (ref 1.005–1.030)
pH: 8 (ref 5.0–8.0)

## 2019-06-05 LAB — COMPREHENSIVE METABOLIC PANEL
ALT: 29 U/L (ref 0–44)
AST: 26 U/L (ref 15–41)
Albumin: 3.3 g/dL — ABNORMAL LOW (ref 3.5–5.0)
Alkaline Phosphatase: 39 U/L (ref 38–126)
Anion gap: 12 (ref 5–15)
BUN: 10 mg/dL (ref 8–23)
CO2: 27 mmol/L (ref 22–32)
Calcium: 9.6 mg/dL (ref 8.9–10.3)
Chloride: 99 mmol/L (ref 98–111)
Creatinine, Ser: 1 mg/dL (ref 0.44–1.00)
GFR calc Af Amer: >60 mL/min (ref >60)
GFR calc non Af Amer: 59 mL/min — ABNORMAL LOW (ref >60)
Glucose, Bld: 115 mg/dL — ABNORMAL HIGH (ref 70–99)
Potassium: 3.7 mmol/L (ref 3.5–5.1)
Sodium: 138 mmol/L (ref 135–145)
Total Bilirubin: 0.8 mg/dL (ref 0.3–1.2)
Total Protein: 6.6 g/dL (ref 6.5–8.1)

## 2019-06-05 LAB — CBC
HCT: 35.8 % — ABNORMAL LOW (ref 36.0–46.0)
Hemoglobin: 11.3 g/dL — ABNORMAL LOW (ref 12.0–15.0)
MCH: 26.8 pg (ref 26.0–34.0)
MCHC: 31.6 g/dL (ref 30.0–36.0)
MCV: 85 fL (ref 80.0–100.0)
Platelets: 218 10*3/uL (ref 150–400)
RBC: 4.21 MIL/uL (ref 3.87–5.11)
RDW: 19.9 % — ABNORMAL HIGH (ref 11.5–15.5)
WBC: 3 10*3/uL — ABNORMAL LOW (ref 4.0–10.5)
nRBC: 0 % (ref 0.0–0.2)

## 2019-06-05 LAB — POC OCCULT BLOOD, ED: Fecal Occult Bld: NEGATIVE

## 2019-06-05 LAB — LIPASE, BLOOD: Lipase: 21 U/L (ref 11–51)

## 2019-06-05 MED ORDER — GLYCERIN (ADULT) 2 G RE SUPP
1.0000 | RECTAL | 0 refills | Status: DC | PRN
Start: 2019-06-05 — End: 2019-06-05

## 2019-06-05 MED ORDER — PROMETHAZINE HCL 25 MG PO TABS: 25.0000 mg | ORAL_TABLET | Freq: Four times a day (QID) | ORAL | 0 refills | Status: DC | PRN

## 2019-06-05 MED ORDER — SORBITOL 70 % SOLN
960.0000 mL | TOPICAL_OIL | Freq: Once | ORAL | Status: AC
  Administered 2019-06-05: 960 mL via RECTAL
  Filled 2019-06-05 (×3): qty 473

## 2019-06-05 MED ORDER — SODIUM CHLORIDE 0.9 % IV BOLUS
500.0000 mL | Freq: Once | INTRAVENOUS | Status: AC
  Administered 2019-06-05: 500 mL via INTRAVENOUS

## 2019-06-05 MED ORDER — SODIUM CHLORIDE 0.9% FLUSH: 3.0000 mL | Freq: Once | INTRAVENOUS | Status: DC

## 2019-06-05 MED ORDER — PROMETHAZINE HCL 25 MG PO TABS
25.0000 mg | ORAL_TABLET | Freq: Four times a day (QID) | ORAL | 0 refills | Status: AC | PRN
Start: 2019-06-05 — End: ?

## 2019-06-05 MED ORDER — PROCHLORPERAZINE MALEATE 5 MG PO TABS
10.0000 mg | ORAL_TABLET | Freq: Once | ORAL | Status: AC
  Administered 2019-06-05: 10 mg via ORAL
  Filled 2019-06-05: qty 2

## 2019-06-05 MED ORDER — GLYCERIN (ADULT) 2 G RE SUPP: 1.0000 | RECTAL | 0 refills | Status: DC | PRN

## 2019-06-05 MED ORDER — PROMETHAZINE HCL 25 MG/ML IJ SOLN: 12.5000 mg | Freq: Once | INTRAMUSCULAR | Status: DC

## 2019-06-05 MED ORDER — PROMETHAZINE HCL 25 MG/ML IJ SOLN
12.5000 mg | Freq: Once | INTRAMUSCULAR | Status: AC
  Administered 2019-06-05: 12.5 mg via INTRAVENOUS
  Filled 2019-06-05: qty 1

## 2019-06-05 MED ORDER — PROCHLORPERAZINE EDISYLATE 10 MG/2ML IJ SOLN
10.0000 mg | Freq: Once | INTRAMUSCULAR | Status: DC
  Filled 2019-06-05: qty 2

## 2019-06-05 NOTE — ED Notes (Signed)
Provided pt with water for fluid challenge.

## 2019-06-05 NOTE — ED Notes (Signed)
Patient verbalizes understanding of discharge instructions. Opportunity for questioning and answers were provided. Armband removed by staff, pt discharged from ED.  

## 2019-06-05 NOTE — Discharge Instructions (Signed)
Please read and follow all provided instructions.  Your diagnoses today include:  1. Lower abdominal pain   2. Drug-induced constipation     Tests performed today include: Blood counts and electrolytes Blood tests to check liver and kidney function Blood tests to check pancreas function Urine test to look for infection X-ray of the abdomen - no signs of bowel obstrutcion Vital signs. See below for your results today.   Medications prescribed:  Phenergan (promethazine) - for nausea and vomiting  Take any prescribed medications only as directed.  Home care instructions:  Follow any educational materials contained in this packet.  Follow-up instructions: Please follow-up with your primary care provider in the next 2 days for further evaluation of your symptoms.    Return instructions:  SEEK IMMEDIATE MEDICAL ATTENTION IF: The pain does not go away or becomes severe  A temperature above 101F develops  Repeated vomiting occurs (multiple episodes)  The pain becomes localized to portions of the abdomen. The right side could possibly be appendicitis. In an adult, the left lower portion of the abdomen could be colitis or diverticulitis.  Blood is being passed in stools or vomit (bright red or black tarry stools)  You develop chest pain, difficulty breathing, dizziness or fainting, or become confused, poorly responsive, or inconsolable (young children) If you have any other emergent concerns regarding your health  Additional Information: Abdominal (belly) pain can be caused by many things. Your caregiver performed an examination and possibly ordered blood/urine tests and imaging (CT scan, x-rays, ultrasound). Many cases can be observed and treated at home after initial evaluation in the emergency department. Even though you are being discharged home, abdominal pain can be unpredictable. Therefore, you need a repeated exam if your pain does not resolve, returns, or worsens. Most patients  with abdominal pain don't have to be admitted to the hospital or have surgery, but serious problems like appendicitis and gallbladder attacks can start out as nonspecific pain. Many abdominal conditions cannot be diagnosed in one visit, so follow-up evaluations are very important.  Your vital signs today were: BP 108/69 (BP Location: Left Arm)   Pulse 71   Temp 98.7 F (37.1 C) (Oral)   Resp 16   Ht 5\' 6"  (1.676 m)   Wt 66.7 kg   SpO2 98%   BMI 23.73 kg/m  If your blood pressure (bp) was elevated above 135/85 this visit, please have this repeated by your doctor within one month. --------------

## 2019-06-05 NOTE — ED Triage Notes (Signed)
Pt states she had R ankle surgery on Thursday.  C/o nausea, constipation, and abd pressure.

## 2019-06-05 NOTE — ED Provider Notes (Signed)
[facility] EMERGENCY DEPARTMENT Provider Note   CSN: [id number] Arrival date & time: 06/05/19  0749     History Chief Complaint  Patient presents with  . Abdominal Pain  . Constipation    Stacy Park is a 64 y.o. female.  Patient with history of metastatic breast cancer currently on chemotherapy, narrowing of the rectum related to CA, ankle fracture status post surgery on 06/02/2019, history of several pelvic surgeries --presents to the emergency department today with complaint of abdominal pain, pressure, constipation.  Patient was placed on hydrocodone-acetaminophen after surgery.  She states that she has been taking MiraLAX and using suppository at home.  She reports decreased stooling with associated increase in pain.  Current pressure is 7 out of 10.  She has had several episodes of vomiting this morning.  No fever, chest pain, shortness of breath.  She states that when she has had a colonoscopy in the past, they needed to use a pediatric scope due to the narrowing in her rectum.  She has not seen any blood passed.         Past Medical History:  Diagnosis Date  . Breast cancer ([place])    S/P chemoradiation  . Cellulitis    Rt Ankle  . Hypothyroidism   . Mechanical complication of other vascular device, implant, and graft   . Plantar fasciitis   . Tenosynovitis of ankle     Patient Active Problem List   Diagnosis Date Noted  . Fracture dislocation of right ankle 06/02/2019  . Breast cancer ([place])   . Tenosynovitis of ankle   . Cellulitis   . Plantar fasciitis   . Hypothyroidism   . Mechanical complication of other vascular device, implant, and graft     Past Surgical History:  Procedure Laterality Date  . ABDOMINAL HYSTERECTOMY    . centeral IV  age 20years old   with Tunneling and subcutan Port  . CESAREAN SECTION    . MASTECTOMY    . PORT-A-CATH REMOVAL  06/2010   Dr Melynda Ripple  . transcatheter foreign body retrieval   08/20/2010, Dr Dyke Maes   retained catheter fragment in ri heart related to a severed Port A Cath     OB History   No obstetric history on file.     No family history on file.  Social History   Tobacco Use  . Smoking status: Never Smoker  . Smokeless tobacco: Never Used  Substance Use Topics  . Alcohol use: Never  . Drug use: Never    Home Medications Prior to Admission medications   Medication Sig Start Date End Date Taking? Authorizing Provider  acetaminophen (TYLENOL) 500 MG tablet Take 1,000 mg by mouth every 6 (six) hours as needed for mild pain or headache.    [provider]  albuterol (VENTOLIN HFA) 108 (90 Base) MCG/ACT inhaler Inhale 2 puffs into the lungs every 4 (four) hours as needed for shortness of breath or wheezing. 01/03/19   [provider]  ascorbic acid (VITAMIN C) 500 MG tablet Take 1 tablet by mouth daily.    [provider]  aspirin EC 325 MG tablet Take 1 tablet (325 mg total) by mouth in the morning and at bedtime. 06/02/19 07/02/19  Delray Alt, PA-C  calcitRIOL (ROCALTROL) 0.25 MCG capsule Take 0.25 mcg by mouth daily. 06/01/19   [provider]  CALCIUM CARBONATE-VIT D-MIN PO Take 12,000 mg by mouth daily.    [provider]  cetirizine (  ZYRTEC) 10 MG tablet Take 10 mg by mouth daily as needed for allergies.    [provider]  escitalopram (LEXAPRO) 10 MG tablet Take 10 mg by mouth daily. 05/02/19   [provider]  exemestane (AROMASIN) 25 MG tablet Take 25 mg by mouth daily. 05/17/19   [provider]  furosemide (LASIX) 20 MG tablet Take 20 mg by mouth daily as needed for fluid or edema.  04/23/19   [provider]  HYDROcodone-acetaminophen (NORCO/VICODIN) 5-325 MG tablet Take 1-2 tablets by mouth every 6 (six) hours as needed for moderate pain. 06/02/19   Delray Alt, PA-C  levothyroxine (SYNTHROID, LEVOTHROID) 50 MCG tablet Take 50 mcg by mouth daily.      [provider]  LORazepam (ATIVAN) 0.5 MG tablet Take 0.5 mg by mouth daily as needed for anxiety. 01/03/19   [provider]  ondansetron (ZOFRAN) 4 MG tablet Take 4 mg by mouth every 4 (four) hours as needed for nausea/vomiting. 01/29/19   [provider]  potassium chloride (KLOR-CON) 10 MEQ tablet Take 10 mEq by mouth 2 (two) times daily. 05/25/19   [provider]  Probiotic Product (PROBIOTIC-10 PO) Take 1 capsule by mouth daily.    [provider]  prochlorperazine (COMPAZINE) 10 MG tablet Take 10 mg by mouth every 6 (six) hours as needed for nausea/vomiting. 03/25/19   [provider]  traZODone (DESYREL) 150 MG tablet Take 150 mg by mouth at bedtime. 05/11/19   [provider]  zinc gluconate 50 MG tablet Take 1 tablet by mouth daily.    [provider]    Allergies    Latex and Penicillins  Review of Systems   Review of Systems  Constitutional: Negative for fever.  HENT: Negative for rhinorrhea and sore throat.   Eyes: Negative for redness.  Respiratory: Negative for cough.   Cardiovascular: Negative for chest pain.  Gastrointestinal: Positive for abdominal pain, constipation, nausea and vomiting. Negative for diarrhea.  Genitourinary: Negative for dysuria.  Musculoskeletal: Negative for myalgias.  Skin: Negative for rash.  Neurological: Negative for headaches.    Physical Exam Updated Vital Signs BP (!) 141/90 (BP Location: Right Arm)   Pulse 99   Temp 98.7 F (37.1 C) (Oral)   Resp 18   Ht 5\' 6"  (1.676 m)   Wt 66.7 kg   SpO2 98%   BMI 23.73 kg/m   Physical Exam Vitals and nursing note reviewed.  Constitutional:      Appearance: She is well-developed.  HENT:     Head: Normocephalic and atraumatic.  Eyes:     General:        Right eye: No discharge.        Left eye: No discharge.     Conjunctiva/sclera: Conjunctivae normal.  Cardiovascular:     Rate and Rhythm: Normal rate and regular rhythm.      Heart sounds: Normal heart sounds.  Pulmonary:     Effort: Pulmonary effort is normal.     Breath sounds: Normal breath sounds.  Abdominal:     Palpations: Abdomen is soft.     Tenderness: There is no abdominal tenderness.  Genitourinary:    Exam position: Knee-chest position.     Rectum: Guaiac result negative. No tenderness.     Comments: Narrowing noted 4-5cm into the rectum. It feels like there could be a stool ball just proximal to that narrowing. There is some scar-like tissue felt on the anterior wall of the rectal  vault.  Musculoskeletal:     Cervical back: Normal range of motion and neck supple.  Skin:    General: Skin is warm and dry.  Neurological:     Mental Status: She is alert.     ED Results / Procedures / Treatments   Labs (all labs ordered are listed, but only abnormal results are displayed) Labs Reviewed  COMPREHENSIVE METABOLIC PANEL - Abnormal; Notable for the following components:      Result Value   Glucose, Bld 115 (*)    Albumin 3.3 (*)    GFR calc non Af Amer 59 (*)    All other components within normal limits  CBC - Abnormal; Notable for the following components:   WBC 3.0 (*)    Hemoglobin 11.3 (*)    HCT 35.8 (*)    RDW 19.9 (*)    All other components within normal limits  URINALYSIS, ROUTINE W REFLEX MICROSCOPIC - Abnormal; Notable for the following components:   APPearance CLOUDY (*)    Hgb urine dipstick SMALL (*)    Ketones, ur 5 (*)    All other components within normal limits  LIPASE, BLOOD  POC OCCULT BLOOD, ED    EKG None  Radiology DG Abdomen 1 View  Result Date: 06/05/2019 CLINICAL DATA:  64 year old female with abdominal pain and constipation. History of breast cancer. EXAM: ABDOMEN - 1 VIEW COMPARISON:  03/16/2019 CT FINDINGS: Nondistended gas-filled loops of small bowel are noted within the RIGHT abdomen. No dilated small bowel loops identified. No suspicious calcifications are present. No acute bony abnormalities are  noted. IMPRESSION: Nonspecific nonobstructive bowel gas pattern. Electronically Signed   By: Margarette Canada M.D.   On: 06/05/2019 12:07    Procedures Procedures (including critical care time)  Medications Ordered in ED Medications  sodium chloride flush (NS) 0.9 % injection 3 mL (has no administration in time range)  sorbitol, milk of mag, mineral oil, glycerin (SMOG) enema (960 mLs Rectal Given 06/05/19 1422)  prochlorperazine (COMPAZINE) tablet 10 mg (10 mg Oral Given 06/05/19 1238)  promethazine (PHENERGAN) injection 12.5 mg (12.5 mg Intravenous Given 06/05/19 1544)  sodium chloride 0.9 % bolus 500 mL (0 mLs Intravenous Stopped 06/05/19 1716)    ED Course  I have reviewed the triage vital signs and the nursing notes.  Pertinent labs & imaging results that were available during my care of the patient were reviewed by me and considered in my medical decision making (see chart for details).  Patient seen and examined. Rectal exam performed with RN chaperone. Will obtain plain films to eval for signs of obstruction. May need enema.   Vital signs reviewed and are as follows: BP (!) 141/90 (BP Location: Right Arm)   Pulse 99   Temp 98.7 F (37.1 C) (Oral)   Resp 18   Ht 5\' 6"  (1.676 m)   Wt 66.7 kg   SpO2 98%   BMI 23.73 kg/m   3:26 PM X-ray results reviewed with patient.  She was given an enema with some stool return.  Patient still feels pressure in her abdomen.  She did have an episode of vomiting after taking oral Compazine.  We will give IV fluid bolus and IV medication to ensure that the patient is able to tolerate fluids.  We discussed that she likely need continued treatment with MiraLAX, suppositories at home.  However, I would like her to feel better and demonstrate that she can keep down fluids prior to discharge.  5:25 PM patient doing  a bit better.  She still appears uncomfortable.  On reexam she has some mild epigastric pain as well as lower abdominal pain.  No rebound or  guarding.  She does not wince with palpation.  Discussion had with patient, her son, and daughter-in-law by telephone regarding work-up and results to this point.  Discussed discharge to home versus continued evaluation with CT imaging for further reassurance.  Patient is comfortable with discharge home at this point.  She has follow-up with her oncologist tomorrow.  We encouraged continued bowel regimen with MiraLAX, glycerin suppositories.  Discussed need to avoid hydrocodone if able.  The patient was urged to return to the Emergency Department immediately with worsening of current symptoms, worsening abdominal pain, persistent vomiting, blood noted in stools, fever, or any other concerns. The patient verbalized understanding.     Clinical Course as of Jun 04 1524  Sun Jun 05, 8863  [id number] 64 year old female here with generalized abdominal pain and some constipation in the setting of having a recent ankle surgery and on oral narcotics.  Lab work not showing any significant abnormalities.  Getting an enema.  Disposition per results of treatment.   [MB]    Clinical Course User Index [MB] Hayden Rasmussen, MD   MDM Rules/Calculators/A&P                      Patient with abdominal pain.  Patient has metastatic breast cancer causing narrowing in the rectum, recent use of narcotics after ankle surgery.  History is suggestive of drug-induced constipation.  X-ray without signs of small bowel obstruction or free air.  Vitals are stable, no fever. Labs reassuring.  Mild leukopenia noted likely related to chemotherapy.  No signs of severe dehydration, patient is now tolerating PO's in ED. Lungs are clear and no signs suggestive of PNA. Low concern for appendicitis, cholecystitis, pancreatitis, ruptured viscus, UTI, kidney stone, aortic dissection, aortic aneurysm or other emergent abdominal etiology.  Appreciate that chemotherapy can change typical response to intra-abdominal processes, however patient's exam  overall is reassuring.  She has a supportive family who can monitor her and she seems willing to return if symptoms worsen.      Final Clinical Impression(s) / ED Diagnoses Final diagnoses:  Lower abdominal pain  Drug-induced constipation    Rx / DC Orders ED Discharge Orders         Ordered    promethazine (PHENERGAN) 25 MG tablet  Every 6 hours PRN,   Status:  Discontinued     06/05/19 1720    glycerin adult 2 g suppository  As needed,   Status:  Discontinued     06/05/19 1720    promethazine (PHENERGAN) 25 MG tablet  Every 6 hours PRN     06/05/19 1724    glycerin adult 2 g suppository  As needed     06/05/19 1724           Carlisle Cater, PA-C 06/05/19 1730    Hayden Rasmussen, MD 06/05/19 2204

## 2019-06-05 NOTE — ED Notes (Signed)
Called pharm to f/u on msg sent for enema.

## 2019-06-06 DIAGNOSIS — R609 Edema, unspecified: Secondary | ICD-10-CM | POA: Diagnosis not present

## 2019-06-06 DIAGNOSIS — I890 Lymphedema, not elsewhere classified: Secondary | ICD-10-CM | POA: Diagnosis not present

## 2019-06-06 DIAGNOSIS — K5900 Constipation, unspecified: Secondary | ICD-10-CM | POA: Diagnosis not present

## 2019-06-06 DIAGNOSIS — C7951 Secondary malignant neoplasm of bone: Secondary | ICD-10-CM | POA: Diagnosis not present

## 2019-06-06 DIAGNOSIS — R112 Nausea with vomiting, unspecified: Secondary | ICD-10-CM | POA: Diagnosis not present

## 2019-06-06 DIAGNOSIS — C50111 Malignant neoplasm of central portion of right female breast: Secondary | ICD-10-CM | POA: Diagnosis not present

## 2019-06-06 DIAGNOSIS — D509 Iron deficiency anemia, unspecified: Secondary | ICD-10-CM | POA: Diagnosis not present

## 2019-06-06 DIAGNOSIS — C778 Secondary and unspecified malignant neoplasm of lymph nodes of multiple regions: Secondary | ICD-10-CM | POA: Diagnosis not present

## 2019-06-06 DIAGNOSIS — Z515 Encounter for palliative care: Secondary | ICD-10-CM | POA: Diagnosis not present

## 2019-06-06 DIAGNOSIS — R0989 Other specified symptoms and signs involving the circulatory and respiratory systems: Secondary | ICD-10-CM | POA: Diagnosis not present

## 2019-06-07 DIAGNOSIS — I890 Lymphedema, not elsewhere classified: Secondary | ICD-10-CM | POA: Diagnosis not present

## 2019-06-07 DIAGNOSIS — C7951 Secondary malignant neoplasm of bone: Secondary | ICD-10-CM | POA: Diagnosis not present

## 2019-06-07 DIAGNOSIS — R110 Nausea: Secondary | ICD-10-CM | POA: Diagnosis not present

## 2019-06-07 DIAGNOSIS — R6883 Chills (without fever): Secondary | ICD-10-CM | POA: Diagnosis not present

## 2019-06-07 DIAGNOSIS — Z515 Encounter for palliative care: Secondary | ICD-10-CM | POA: Diagnosis not present

## 2019-06-07 DIAGNOSIS — R1010 Upper abdominal pain, unspecified: Secondary | ICD-10-CM | POA: Diagnosis not present

## 2019-06-07 DIAGNOSIS — R609 Edema, unspecified: Secondary | ICD-10-CM | POA: Diagnosis not present

## 2019-06-07 DIAGNOSIS — D509 Iron deficiency anemia, unspecified: Secondary | ICD-10-CM | POA: Diagnosis not present

## 2019-06-07 DIAGNOSIS — C778 Secondary and unspecified malignant neoplasm of lymph nodes of multiple regions: Secondary | ICD-10-CM | POA: Diagnosis not present

## 2019-06-07 DIAGNOSIS — R7401 Elevation of levels of liver transaminase levels: Secondary | ICD-10-CM | POA: Diagnosis not present

## 2019-06-07 DIAGNOSIS — C50111 Malignant neoplasm of central portion of right female breast: Secondary | ICD-10-CM | POA: Diagnosis not present

## 2019-06-07 DIAGNOSIS — R0989 Other specified symptoms and signs involving the circulatory and respiratory systems: Secondary | ICD-10-CM | POA: Diagnosis not present

## 2019-06-08 DIAGNOSIS — C50111 Malignant neoplasm of central portion of right female breast: Secondary | ICD-10-CM | POA: Diagnosis not present

## 2019-06-08 DIAGNOSIS — C778 Secondary and unspecified malignant neoplasm of lymph nodes of multiple regions: Secondary | ICD-10-CM | POA: Diagnosis not present

## 2019-06-08 DIAGNOSIS — R1010 Upper abdominal pain, unspecified: Secondary | ICD-10-CM | POA: Diagnosis not present

## 2019-06-08 DIAGNOSIS — C7951 Secondary malignant neoplasm of bone: Secondary | ICD-10-CM | POA: Diagnosis not present

## 2019-06-08 DIAGNOSIS — C801 Malignant (primary) neoplasm, unspecified: Secondary | ICD-10-CM | POA: Diagnosis not present

## 2019-06-09 DIAGNOSIS — C50111 Malignant neoplasm of central portion of right female breast: Secondary | ICD-10-CM | POA: Diagnosis not present

## 2019-06-09 DIAGNOSIS — C50919 Malignant neoplasm of unspecified site of unspecified female breast: Secondary | ICD-10-CM | POA: Diagnosis not present

## 2019-06-10 DIAGNOSIS — C778 Secondary and unspecified malignant neoplasm of lymph nodes of multiple regions: Secondary | ICD-10-CM | POA: Diagnosis not present

## 2019-06-10 DIAGNOSIS — C50111 Malignant neoplasm of central portion of right female breast: Secondary | ICD-10-CM | POA: Diagnosis not present

## 2019-06-10 DIAGNOSIS — C7951 Secondary malignant neoplasm of bone: Secondary | ICD-10-CM | POA: Diagnosis not present

## 2019-06-13 DIAGNOSIS — C50111 Malignant neoplasm of central portion of right female breast: Secondary | ICD-10-CM | POA: Diagnosis not present

## 2019-06-13 DIAGNOSIS — C7951 Secondary malignant neoplasm of bone: Secondary | ICD-10-CM | POA: Diagnosis not present

## 2019-06-13 DIAGNOSIS — C778 Secondary and unspecified malignant neoplasm of lymph nodes of multiple regions: Secondary | ICD-10-CM | POA: Diagnosis not present

## 2019-06-14 DIAGNOSIS — S82851D Displaced trimalleolar fracture of right lower leg, subsequent encounter for closed fracture with routine healing: Secondary | ICD-10-CM | POA: Diagnosis not present

## 2019-06-16 DIAGNOSIS — C50919 Malignant neoplasm of unspecified site of unspecified female breast: Secondary | ICD-10-CM | POA: Diagnosis not present

## 2019-06-20 DIAGNOSIS — D701 Agranulocytosis secondary to cancer chemotherapy: Secondary | ICD-10-CM | POA: Diagnosis not present

## 2019-06-20 DIAGNOSIS — C50111 Malignant neoplasm of central portion of right female breast: Secondary | ICD-10-CM | POA: Diagnosis not present

## 2019-06-20 DIAGNOSIS — C778 Secondary and unspecified malignant neoplasm of lymph nodes of multiple regions: Secondary | ICD-10-CM | POA: Diagnosis not present

## 2019-06-20 DIAGNOSIS — D72819 Decreased white blood cell count, unspecified: Secondary | ICD-10-CM | POA: Diagnosis not present

## 2019-06-20 DIAGNOSIS — Z86000 Personal history of in-situ neoplasm of breast: Secondary | ICD-10-CM | POA: Diagnosis not present

## 2019-06-20 DIAGNOSIS — C7951 Secondary malignant neoplasm of bone: Secondary | ICD-10-CM | POA: Diagnosis not present

## 2019-06-20 DIAGNOSIS — D509 Iron deficiency anemia, unspecified: Secondary | ICD-10-CM | POA: Diagnosis not present

## 2019-06-27 DIAGNOSIS — C7951 Secondary malignant neoplasm of bone: Secondary | ICD-10-CM | POA: Diagnosis not present

## 2019-06-27 DIAGNOSIS — C778 Secondary and unspecified malignant neoplasm of lymph nodes of multiple regions: Secondary | ICD-10-CM | POA: Diagnosis not present

## 2019-06-27 DIAGNOSIS — C50111 Malignant neoplasm of central portion of right female breast: Secondary | ICD-10-CM | POA: Diagnosis not present

## 2019-07-04 DIAGNOSIS — R634 Abnormal weight loss: Secondary | ICD-10-CM | POA: Diagnosis not present

## 2019-07-04 DIAGNOSIS — G629 Polyneuropathy, unspecified: Secondary | ICD-10-CM | POA: Diagnosis not present

## 2019-07-04 DIAGNOSIS — C50111 Malignant neoplasm of central portion of right female breast: Secondary | ICD-10-CM | POA: Diagnosis not present

## 2019-07-04 DIAGNOSIS — C7951 Secondary malignant neoplasm of bone: Secondary | ICD-10-CM | POA: Diagnosis not present

## 2019-07-04 DIAGNOSIS — C778 Secondary and unspecified malignant neoplasm of lymph nodes of multiple regions: Secondary | ICD-10-CM | POA: Diagnosis not present

## 2019-07-04 DIAGNOSIS — E871 Hypo-osmolality and hyponatremia: Secondary | ICD-10-CM | POA: Diagnosis not present

## 2019-07-06 DIAGNOSIS — C50919 Malignant neoplasm of unspecified site of unspecified female breast: Secondary | ICD-10-CM | POA: Diagnosis not present

## 2019-07-08 DIAGNOSIS — R159 Full incontinence of feces: Secondary | ICD-10-CM | POA: Diagnosis not present

## 2019-07-08 DIAGNOSIS — Z20822 Contact with and (suspected) exposure to covid-19: Secondary | ICD-10-CM | POA: Diagnosis not present

## 2019-07-08 DIAGNOSIS — Z01812 Encounter for preprocedural laboratory examination: Secondary | ICD-10-CM | POA: Diagnosis not present

## 2019-07-12 DIAGNOSIS — C7951 Secondary malignant neoplasm of bone: Secondary | ICD-10-CM | POA: Diagnosis not present

## 2019-07-12 DIAGNOSIS — S82851D Displaced trimalleolar fracture of right lower leg, subsequent encounter for closed fracture with routine healing: Secondary | ICD-10-CM | POA: Diagnosis not present

## 2019-07-12 DIAGNOSIS — C778 Secondary and unspecified malignant neoplasm of lymph nodes of multiple regions: Secondary | ICD-10-CM | POA: Diagnosis not present

## 2019-07-12 DIAGNOSIS — C50111 Malignant neoplasm of central portion of right female breast: Secondary | ICD-10-CM | POA: Diagnosis not present

## 2019-07-13 DIAGNOSIS — C771 Secondary and unspecified malignant neoplasm of intrathoracic lymph nodes: Secondary | ICD-10-CM | POA: Diagnosis not present

## 2019-07-13 DIAGNOSIS — C7951 Secondary malignant neoplasm of bone: Secondary | ICD-10-CM | POA: Diagnosis not present

## 2019-07-13 DIAGNOSIS — C7981 Secondary malignant neoplasm of breast: Secondary | ICD-10-CM | POA: Diagnosis not present

## 2019-07-13 DIAGNOSIS — C50111 Malignant neoplasm of central portion of right female breast: Secondary | ICD-10-CM | POA: Diagnosis not present

## 2019-07-13 DIAGNOSIS — C801 Malignant (primary) neoplasm, unspecified: Secondary | ICD-10-CM | POA: Diagnosis not present

## 2019-07-15 DIAGNOSIS — E039 Hypothyroidism, unspecified: Secondary | ICD-10-CM | POA: Diagnosis not present

## 2019-07-15 DIAGNOSIS — C785 Secondary malignant neoplasm of large intestine and rectum: Secondary | ICD-10-CM | POA: Diagnosis not present

## 2019-07-15 DIAGNOSIS — Z79899 Other long term (current) drug therapy: Secondary | ICD-10-CM | POA: Diagnosis not present

## 2019-07-15 DIAGNOSIS — K56600 Partial intestinal obstruction, unspecified as to cause: Secondary | ICD-10-CM | POA: Diagnosis not present

## 2019-07-15 DIAGNOSIS — K5720 Diverticulitis of large intestine with perforation and abscess without bleeding: Secondary | ICD-10-CM | POA: Diagnosis not present

## 2019-07-15 DIAGNOSIS — Z79891 Long term (current) use of opiate analgesic: Secondary | ICD-10-CM | POA: Diagnosis not present

## 2019-07-15 DIAGNOSIS — C50919 Malignant neoplasm of unspecified site of unspecified female breast: Secondary | ICD-10-CM | POA: Diagnosis not present

## 2019-07-15 DIAGNOSIS — K5651 Intestinal adhesions [bands], with partial obstruction: Secondary | ICD-10-CM | POA: Diagnosis not present

## 2019-07-18 DIAGNOSIS — F329 Major depressive disorder, single episode, unspecified: Secondary | ICD-10-CM | POA: Diagnosis not present

## 2019-07-18 DIAGNOSIS — C785 Secondary malignant neoplasm of large intestine and rectum: Secondary | ICD-10-CM | POA: Diagnosis not present

## 2019-07-18 DIAGNOSIS — C50919 Malignant neoplasm of unspecified site of unspecified female breast: Secondary | ICD-10-CM | POA: Diagnosis not present

## 2019-07-18 DIAGNOSIS — Z483 Aftercare following surgery for neoplasm: Secondary | ICD-10-CM | POA: Diagnosis not present

## 2019-07-18 DIAGNOSIS — E079 Disorder of thyroid, unspecified: Secondary | ICD-10-CM | POA: Diagnosis not present

## 2019-07-18 DIAGNOSIS — C4491 Basal cell carcinoma of skin, unspecified: Secondary | ICD-10-CM | POA: Diagnosis not present

## 2019-07-18 DIAGNOSIS — Z433 Encounter for attention to colostomy: Secondary | ICD-10-CM | POA: Diagnosis not present

## 2019-07-18 DIAGNOSIS — Z7982 Long term (current) use of aspirin: Secondary | ICD-10-CM | POA: Diagnosis not present

## 2019-07-18 DIAGNOSIS — Z9011 Acquired absence of right breast and nipple: Secondary | ICD-10-CM | POA: Diagnosis not present

## 2019-07-20 DIAGNOSIS — Z433 Encounter for attention to colostomy: Secondary | ICD-10-CM | POA: Diagnosis not present

## 2019-07-20 DIAGNOSIS — C785 Secondary malignant neoplasm of large intestine and rectum: Secondary | ICD-10-CM | POA: Diagnosis not present

## 2019-07-20 DIAGNOSIS — C50919 Malignant neoplasm of unspecified site of unspecified female breast: Secondary | ICD-10-CM | POA: Diagnosis not present

## 2019-07-20 DIAGNOSIS — F329 Major depressive disorder, single episode, unspecified: Secondary | ICD-10-CM | POA: Diagnosis not present

## 2019-07-20 DIAGNOSIS — E079 Disorder of thyroid, unspecified: Secondary | ICD-10-CM | POA: Diagnosis not present

## 2019-07-20 DIAGNOSIS — Z483 Aftercare following surgery for neoplasm: Secondary | ICD-10-CM | POA: Diagnosis not present

## 2019-07-20 DIAGNOSIS — Z7982 Long term (current) use of aspirin: Secondary | ICD-10-CM | POA: Diagnosis not present

## 2019-07-20 DIAGNOSIS — Z9011 Acquired absence of right breast and nipple: Secondary | ICD-10-CM | POA: Diagnosis not present

## 2019-07-20 DIAGNOSIS — C4491 Basal cell carcinoma of skin, unspecified: Secondary | ICD-10-CM | POA: Diagnosis not present

## 2019-07-21 DIAGNOSIS — Z6821 Body mass index (BMI) 21.0-21.9, adult: Secondary | ICD-10-CM | POA: Diagnosis not present

## 2019-07-21 DIAGNOSIS — E039 Hypothyroidism, unspecified: Secondary | ICD-10-CM | POA: Diagnosis not present

## 2019-07-21 DIAGNOSIS — Z789 Other specified health status: Secondary | ICD-10-CM | POA: Diagnosis not present

## 2019-07-25 DIAGNOSIS — C50111 Malignant neoplasm of central portion of right female breast: Secondary | ICD-10-CM | POA: Diagnosis not present

## 2019-07-25 DIAGNOSIS — C7951 Secondary malignant neoplasm of bone: Secondary | ICD-10-CM | POA: Diagnosis not present

## 2019-07-25 DIAGNOSIS — C778 Secondary and unspecified malignant neoplasm of lymph nodes of multiple regions: Secondary | ICD-10-CM | POA: Diagnosis not present

## 2019-07-26 DIAGNOSIS — C785 Secondary malignant neoplasm of large intestine and rectum: Secondary | ICD-10-CM | POA: Diagnosis not present

## 2019-07-26 DIAGNOSIS — C50919 Malignant neoplasm of unspecified site of unspecified female breast: Secondary | ICD-10-CM | POA: Diagnosis not present

## 2019-07-26 DIAGNOSIS — Z7982 Long term (current) use of aspirin: Secondary | ICD-10-CM | POA: Diagnosis not present

## 2019-07-26 DIAGNOSIS — Z433 Encounter for attention to colostomy: Secondary | ICD-10-CM | POA: Diagnosis not present

## 2019-07-26 DIAGNOSIS — E079 Disorder of thyroid, unspecified: Secondary | ICD-10-CM | POA: Diagnosis not present

## 2019-07-26 DIAGNOSIS — Z483 Aftercare following surgery for neoplasm: Secondary | ICD-10-CM | POA: Diagnosis not present

## 2019-07-26 DIAGNOSIS — C4491 Basal cell carcinoma of skin, unspecified: Secondary | ICD-10-CM | POA: Diagnosis not present

## 2019-07-26 DIAGNOSIS — Z9011 Acquired absence of right breast and nipple: Secondary | ICD-10-CM | POA: Diagnosis not present

## 2019-07-26 DIAGNOSIS — F329 Major depressive disorder, single episode, unspecified: Secondary | ICD-10-CM | POA: Diagnosis not present

## 2019-07-28 DIAGNOSIS — Z483 Aftercare following surgery for neoplasm: Secondary | ICD-10-CM | POA: Diagnosis not present

## 2019-07-28 DIAGNOSIS — C785 Secondary malignant neoplasm of large intestine and rectum: Secondary | ICD-10-CM | POA: Diagnosis not present

## 2019-07-28 DIAGNOSIS — Z433 Encounter for attention to colostomy: Secondary | ICD-10-CM | POA: Diagnosis not present

## 2019-07-28 DIAGNOSIS — C4491 Basal cell carcinoma of skin, unspecified: Secondary | ICD-10-CM | POA: Diagnosis not present

## 2019-07-28 DIAGNOSIS — Z7982 Long term (current) use of aspirin: Secondary | ICD-10-CM | POA: Diagnosis not present

## 2019-07-28 DIAGNOSIS — E079 Disorder of thyroid, unspecified: Secondary | ICD-10-CM | POA: Diagnosis not present

## 2019-07-28 DIAGNOSIS — C50919 Malignant neoplasm of unspecified site of unspecified female breast: Secondary | ICD-10-CM | POA: Diagnosis not present

## 2019-07-28 DIAGNOSIS — Z9011 Acquired absence of right breast and nipple: Secondary | ICD-10-CM | POA: Diagnosis not present

## 2019-07-28 DIAGNOSIS — F329 Major depressive disorder, single episode, unspecified: Secondary | ICD-10-CM | POA: Diagnosis not present

## 2019-07-29 DIAGNOSIS — Z933 Colostomy status: Secondary | ICD-10-CM | POA: Diagnosis not present

## 2019-08-01 DIAGNOSIS — Z7982 Long term (current) use of aspirin: Secondary | ICD-10-CM | POA: Diagnosis not present

## 2019-08-01 DIAGNOSIS — C50919 Malignant neoplasm of unspecified site of unspecified female breast: Secondary | ICD-10-CM | POA: Diagnosis not present

## 2019-08-01 DIAGNOSIS — Z433 Encounter for attention to colostomy: Secondary | ICD-10-CM | POA: Diagnosis not present

## 2019-08-01 DIAGNOSIS — F329 Major depressive disorder, single episode, unspecified: Secondary | ICD-10-CM | POA: Diagnosis not present

## 2019-08-01 DIAGNOSIS — Z933 Colostomy status: Secondary | ICD-10-CM | POA: Diagnosis not present

## 2019-08-01 DIAGNOSIS — E079 Disorder of thyroid, unspecified: Secondary | ICD-10-CM | POA: Diagnosis not present

## 2019-08-01 DIAGNOSIS — C785 Secondary malignant neoplasm of large intestine and rectum: Secondary | ICD-10-CM | POA: Diagnosis not present

## 2019-08-01 DIAGNOSIS — C4491 Basal cell carcinoma of skin, unspecified: Secondary | ICD-10-CM | POA: Diagnosis not present

## 2019-08-01 DIAGNOSIS — Z483 Aftercare following surgery for neoplasm: Secondary | ICD-10-CM | POA: Diagnosis not present

## 2019-08-01 DIAGNOSIS — Z9011 Acquired absence of right breast and nipple: Secondary | ICD-10-CM | POA: Diagnosis not present

## 2019-08-04 DIAGNOSIS — Z483 Aftercare following surgery for neoplasm: Secondary | ICD-10-CM | POA: Diagnosis not present

## 2019-08-04 DIAGNOSIS — C785 Secondary malignant neoplasm of large intestine and rectum: Secondary | ICD-10-CM | POA: Diagnosis not present

## 2019-08-04 DIAGNOSIS — Z433 Encounter for attention to colostomy: Secondary | ICD-10-CM | POA: Diagnosis not present

## 2019-08-04 DIAGNOSIS — C4491 Basal cell carcinoma of skin, unspecified: Secondary | ICD-10-CM | POA: Diagnosis not present

## 2019-08-04 DIAGNOSIS — E079 Disorder of thyroid, unspecified: Secondary | ICD-10-CM | POA: Diagnosis not present

## 2019-08-04 DIAGNOSIS — Z9011 Acquired absence of right breast and nipple: Secondary | ICD-10-CM | POA: Diagnosis not present

## 2019-08-04 DIAGNOSIS — Z7982 Long term (current) use of aspirin: Secondary | ICD-10-CM | POA: Diagnosis not present

## 2019-08-04 DIAGNOSIS — C50919 Malignant neoplasm of unspecified site of unspecified female breast: Secondary | ICD-10-CM | POA: Diagnosis not present

## 2019-08-04 DIAGNOSIS — F329 Major depressive disorder, single episode, unspecified: Secondary | ICD-10-CM | POA: Diagnosis not present

## 2019-08-08 DIAGNOSIS — C50111 Malignant neoplasm of central portion of right female breast: Secondary | ICD-10-CM | POA: Diagnosis not present

## 2019-08-08 DIAGNOSIS — I890 Lymphedema, not elsewhere classified: Secondary | ICD-10-CM | POA: Diagnosis not present

## 2019-08-08 DIAGNOSIS — C778 Secondary and unspecified malignant neoplasm of lymph nodes of multiple regions: Secondary | ICD-10-CM | POA: Diagnosis not present

## 2019-08-08 DIAGNOSIS — C7951 Secondary malignant neoplasm of bone: Secondary | ICD-10-CM | POA: Diagnosis not present

## 2019-08-09 DIAGNOSIS — S82851D Displaced trimalleolar fracture of right lower leg, subsequent encounter for closed fracture with routine healing: Secondary | ICD-10-CM | POA: Diagnosis not present

## 2019-08-10 DIAGNOSIS — C50919 Malignant neoplasm of unspecified site of unspecified female breast: Secondary | ICD-10-CM | POA: Diagnosis not present

## 2019-08-10 DIAGNOSIS — C4491 Basal cell carcinoma of skin, unspecified: Secondary | ICD-10-CM | POA: Diagnosis not present

## 2019-08-10 DIAGNOSIS — E079 Disorder of thyroid, unspecified: Secondary | ICD-10-CM | POA: Diagnosis not present

## 2019-08-10 DIAGNOSIS — F329 Major depressive disorder, single episode, unspecified: Secondary | ICD-10-CM | POA: Diagnosis not present

## 2019-08-10 DIAGNOSIS — Z9011 Acquired absence of right breast and nipple: Secondary | ICD-10-CM | POA: Diagnosis not present

## 2019-08-10 DIAGNOSIS — Z7982 Long term (current) use of aspirin: Secondary | ICD-10-CM | POA: Diagnosis not present

## 2019-08-10 DIAGNOSIS — Z433 Encounter for attention to colostomy: Secondary | ICD-10-CM | POA: Diagnosis not present

## 2019-08-10 DIAGNOSIS — C785 Secondary malignant neoplasm of large intestine and rectum: Secondary | ICD-10-CM | POA: Diagnosis not present

## 2019-08-10 DIAGNOSIS — Z483 Aftercare following surgery for neoplasm: Secondary | ICD-10-CM | POA: Diagnosis not present

## 2019-09-05 DIAGNOSIS — C50111 Malignant neoplasm of central portion of right female breast: Secondary | ICD-10-CM | POA: Diagnosis not present

## 2019-09-19 DIAGNOSIS — C50111 Malignant neoplasm of central portion of right female breast: Secondary | ICD-10-CM | POA: Diagnosis not present

## 2019-10-03 DIAGNOSIS — C50111 Malignant neoplasm of central portion of right female breast: Secondary | ICD-10-CM | POA: Diagnosis not present

## 2019-10-10 DIAGNOSIS — C50119 Malignant neoplasm of central portion of unspecified female breast: Secondary | ICD-10-CM | POA: Diagnosis not present

## 2019-11-28 ENCOUNTER — Encounter: Payer: Self-pay | Admitting: Pharmacist

## 2019-11-28 DIAGNOSIS — C7952 Secondary malignant neoplasm of bone marrow: Secondary | ICD-10-CM

## 2019-11-28 DIAGNOSIS — C50111 Malignant neoplasm of central portion of right female breast: Secondary | ICD-10-CM | POA: Insufficient documentation

## 2019-11-28 DIAGNOSIS — C7951 Secondary malignant neoplasm of bone: Secondary | ICD-10-CM | POA: Insufficient documentation

## 2019-12-06 ENCOUNTER — Other Ambulatory Visit: Payer: Self-pay | Admitting: Hematology and Oncology

## 2019-12-06 DIAGNOSIS — C50111 Malignant neoplasm of central portion of right female breast: Secondary | ICD-10-CM | POA: Diagnosis not present

## 2019-12-06 LAB — CBC AND DIFFERENTIAL
HCT: 41 (ref 36–46)
Hemoglobin: 13.6 (ref 12.0–16.0)
Neutrophils Absolute: 4.52
Platelets: 251 (ref 150–399)
WBC: 5.8

## 2019-12-06 LAB — HEPATIC FUNCTION PANEL
ALT: 15 (ref 7–35)
AST: 27 (ref 13–35)
Alkaline Phosphatase: 57 (ref 25–125)
Bilirubin, Total: 0.3

## 2019-12-06 LAB — COMPREHENSIVE METABOLIC PANEL
Albumin: 3.9 (ref 3.5–5.0)
Calcium: 10.1 (ref 8.7–10.7)

## 2019-12-06 LAB — BASIC METABOLIC PANEL
BUN: 27 — AB (ref 4–21)
CO2: 27 — AB (ref 13–22)
Chloride: 100 (ref 99–108)
Creatinine: 1.2 — AB (ref 0.5–1.1)
Glucose: 112
Potassium: 4.1 (ref 3.4–5.3)
Sodium: 138 (ref 137–147)

## 2019-12-06 LAB — CBC: RBC: 4.49 (ref 3.87–5.11)

## 2019-12-07 ENCOUNTER — Inpatient Hospital Stay: Payer: Medicare Other | Attending: Oncology

## 2019-12-07 ENCOUNTER — Other Ambulatory Visit: Payer: Self-pay

## 2019-12-07 VITALS — BP 94/61 | HR 94 | Temp 98.2°F | Resp 20 | Wt 129.5 lb

## 2019-12-07 DIAGNOSIS — Z933 Colostomy status: Secondary | ICD-10-CM | POA: Diagnosis not present

## 2019-12-07 DIAGNOSIS — Z170 Estrogen receptor positive status [ER+]: Secondary | ICD-10-CM | POA: Insufficient documentation

## 2019-12-07 DIAGNOSIS — C50111 Malignant neoplasm of central portion of right female breast: Secondary | ICD-10-CM

## 2019-12-07 DIAGNOSIS — C50919 Malignant neoplasm of unspecified site of unspecified female breast: Secondary | ICD-10-CM | POA: Diagnosis present

## 2019-12-07 DIAGNOSIS — C7951 Secondary malignant neoplasm of bone: Secondary | ICD-10-CM | POA: Diagnosis not present

## 2019-12-07 MED ORDER — FULVESTRANT 250 MG/5ML IM SOLN
INTRAMUSCULAR | Status: AC
  Filled 2019-12-07: qty 10

## 2019-12-07 MED ORDER — DENOSUMAB 120 MG/1.7ML ~~LOC~~ SOLN
120.0000 mg | Freq: Once | SUBCUTANEOUS | Status: AC
  Administered 2019-12-07: 120 mg via SUBCUTANEOUS

## 2019-12-07 MED ORDER — FULVESTRANT 250 MG/5ML IM SOLN
500.0000 mg | Freq: Once | INTRAMUSCULAR | Status: AC
  Administered 2019-12-07: 500 mg via INTRAMUSCULAR

## 2019-12-07 MED ORDER — DENOSUMAB 120 MG/1.7ML ~~LOC~~ SOLN
SUBCUTANEOUS | Status: AC
  Filled 2019-12-07: qty 1.7

## 2019-12-07 NOTE — Progress Notes (Signed)
PT STABLE AT TIME OF DISCHARGE 

## 2019-12-07 NOTE — Patient Instructions (Signed)
Denosumab injection What is this medicine? DENOSUMAB (den oh sue mab) slows bone breakdown. Prolia is used to treat osteoporosis in women after menopause and in men, and in people who are taking corticosteroids for 6 months or more. Xgeva is used to treat a high calcium level due to cancer and to prevent bone fractures and other bone problems caused by multiple myeloma or cancer bone metastases. Xgeva is also used to treat giant cell tumor of the bone. This medicine may be used for other purposes; ask your health care provider or pharmacist if you have questions. COMMON BRAND NAME(S): Prolia, XGEVA What should I tell my health care provider before I take this medicine? They need to know if you have any of these conditions:  dental disease  having surgery or tooth extraction  infection  kidney disease  low levels of calcium or Vitamin D in the blood  malnutrition  on hemodialysis  skin conditions or sensitivity  thyroid or parathyroid disease  an unusual reaction to denosumab, other medicines, foods, dyes, or preservatives  pregnant or trying to get pregnant  breast-feeding How should I use this medicine? This medicine is for injection under the skin. It is given by a health care professional in a hospital or clinic setting. A special MedGuide will be given to you before each treatment. Be sure to read this information carefully each time. For Prolia, talk to your pediatrician regarding the use of this medicine in children. Special care may be needed. For Xgeva, talk to your pediatrician regarding the use of this medicine in children. While this drug may be prescribed for children as young as 13 years for selected conditions, precautions do apply. Overdosage: If you think you have taken too much of this medicine contact a poison control center or emergency room at once. NOTE: This medicine is only for you. Do not share this medicine with others. What if I miss a dose? It is  important not to miss your dose. Call your doctor or health care professional if you are unable to keep an appointment. What may interact with this medicine? Do not take this medicine with any of the following medications:  other medicines containing denosumab This medicine may also interact with the following medications:  medicines that lower your chance of fighting infection  steroid medicines like prednisone or cortisone This list may not describe all possible interactions. Give your health care provider a list of all the medicines, herbs, non-prescription drugs, or dietary supplements you use. Also tell them if you smoke, drink alcohol, or use illegal drugs. Some items may interact with your medicine. What should I watch for while using this medicine? Visit your doctor or health care professional for regular checks on your progress. Your doctor or health care professional may order blood tests and other tests to see how you are doing. Call your doctor or health care professional for advice if you get a fever, chills or sore throat, or other symptoms of a cold or flu. Do not treat yourself. This drug may decrease your body's ability to fight infection. Try to avoid being around people who are sick. You should make sure you get enough calcium and vitamin D while you are taking this medicine, unless your doctor tells you not to. Discuss the foods you eat and the vitamins you take with your health care professional. See your dentist regularly. Brush and floss your teeth as directed. Before you have any dental work done, tell your dentist you are   receiving this medicine. Do not become pregnant while taking this medicine or for 5 months after stopping it. Talk with your doctor or health care professional about your birth control options while taking this medicine. Women should inform their doctor if they wish to become pregnant or think they might be pregnant. There is a potential for serious side  effects to an unborn child. Talk to your health care professional or pharmacist for more information. What side effects may I notice from receiving this medicine? Side effects that you should report to your doctor or health care professional as soon as possible:  allergic reactions like skin rash, itching or hives, swelling of the face, lips, or tongue  bone pain  breathing problems  dizziness  jaw pain, especially after dental work  redness, blistering, peeling of the skin  signs and symptoms of infection like fever or chills; cough; sore throat; pain or trouble passing urine  signs of low calcium like fast heartbeat, muscle cramps or muscle pain; pain, tingling, numbness in the hands or feet; seizures  unusual bleeding or bruising  unusually weak or tired Side effects that usually do not require medical attention (report to your doctor or health care professional if they continue or are bothersome):  constipation  diarrhea  headache  joint pain  loss of appetite  muscle pain  runny nose  tiredness  upset stomach This list may not describe all possible side effects. Call your doctor for medical advice about side effects. You may report side effects to FDA at 1-800-FDA-1088. Where should I keep my medicine? This medicine is only given in a clinic, doctor's office, or other health care setting and will not be stored at home. NOTE: This sheet is a summary. It may not cover all possible information. If you have questions about this medicine, talk to your doctor, pharmacist, or health care provider.  2020 [employer] (2017-06-05 16:10:44) Fulvestrant injection What is this medicine? FULVESTRANT (ful VES trant) blocks the effects of estrogen. It is used to treat breast cancer. This medicine may be used for other purposes; ask your health care provider or pharmacist if you have questions. COMMON BRAND NAME(S): FASLODEX What should I tell my health care  provider before I take this medicine? They need to know if you have any of these conditions:  bleeding disorders  liver disease  low blood counts, like low white cell, platelet, or red cell counts  an unusual or allergic reaction to fulvestrant, other medicines, foods, dyes, or preservatives  pregnant or trying to get pregnant  breast-feeding How should I use this medicine? This medicine is for injection into a muscle. It is usually given by a health care professional in a hospital or clinic setting. Talk to your pediatrician regarding the use of this medicine in children. Special care may be needed. Overdosage: If you think you have taken too much of this medicine contact a poison control center or emergency room at once. NOTE: This medicine is only for you. Do not share this medicine with others. What if I miss a dose? It is important not to miss your dose. Call your doctor or health care professional if you are unable to keep an appointment. What may interact with this medicine?  medicines that treat or prevent blood clots like warfarin, enoxaparin, dalteparin, apixaban, dabigatran, and rivaroxaban This list may not describe all possible interactions. Give your health care provider a list of all the medicines, herbs, non-prescription drugs, or dietary supplements you use.   Also tell them if you smoke, drink alcohol, or use illegal drugs. Some items may interact with your medicine. What should I watch for while using this medicine? Your condition will be monitored carefully while you are receiving this medicine. You will need important blood work done while you are taking this medicine. Do not become pregnant while taking this medicine or for at least 1 year after stopping it. Women of child-bearing potential will need to have a negative pregnancy test before starting this medicine. Women should inform their doctor if they wish to become pregnant or think they might be pregnant. There is  a potential for serious side effects to an unborn child. Men should inform their doctors if they wish to father a child. This medicine may lower sperm counts. Talk to your health care professional or pharmacist for more information. Do not breast-feed an infant while taking this medicine or for 1 year after the last dose. What side effects may I notice from receiving this medicine? Side effects that you should report to your doctor or health care professional as soon as possible:  allergic reactions like skin rash, itching or hives, swelling of the face, lips, or tongue  feeling faint or lightheaded, falls  pain, tingling, numbness, or weakness in the legs  signs and symptoms of infection like fever or chills; cough; flu-like symptoms; sore throat  vaginal bleeding Side effects that usually do not require medical attention (report to your doctor or health care professional if they continue or are bothersome):  aches, pains  constipation  diarrhea  headache  hot flashes  nausea, vomiting  pain at site where injected  stomach pain This list may not describe all possible side effects. Call your doctor for medical advice about side effects. You may report side effects to FDA at 1-800-FDA-1088. Where should I keep my medicine? This drug is given in a hospital or clinic and will not be stored at home. NOTE: This sheet is a summary. It may not cover all possible information. If you have questions about this medicine, talk to your doctor, pharmacist, or health care provider.  2020 [employer] Standard (2017-05-07 11:34:41)  

## 2019-12-09 NOTE — Progress Notes (Signed)
PT STABLE AT TIME OF DISCHARGE 

## 2019-12-21 ENCOUNTER — Other Ambulatory Visit: Payer: Self-pay | Admitting: Hematology and Oncology

## 2019-12-21 DIAGNOSIS — Z170 Estrogen receptor positive status [ER+]: Secondary | ICD-10-CM

## 2019-12-21 DIAGNOSIS — C50111 Malignant neoplasm of central portion of right female breast: Secondary | ICD-10-CM

## 2019-12-27 ENCOUNTER — Other Ambulatory Visit: Payer: Self-pay | Admitting: Hematology and Oncology

## 2019-12-27 DIAGNOSIS — C7951 Secondary malignant neoplasm of bone: Secondary | ICD-10-CM

## 2019-12-27 LAB — HEPATIC FUNCTION PANEL
ALT: 28 (ref 7–35)
AST: 59 — AB (ref 13–35)
Alkaline Phosphatase: 61 (ref 25–125)
Bilirubin, Total: 0.4

## 2019-12-27 LAB — BASIC METABOLIC PANEL
BUN: 10 (ref 4–21)
CO2: 29 — AB (ref 13–22)
Chloride: 101 (ref 99–108)
Creatinine: 0.7 (ref 0.5–1.1)
Glucose: 195
Potassium: 3.6 (ref 3.4–5.3)
Sodium: 139 (ref 137–147)

## 2019-12-27 LAB — CBC: RBC: 3.31 — AB (ref 3.87–5.11)

## 2019-12-27 LAB — CBC AND DIFFERENTIAL
HCT: 33 — AB (ref 36–46)
Hemoglobin: 10.9 — AB (ref 12.0–16.0)
Neutrophils Absolute: 2.6
Platelets: 179 (ref 150–399)
WBC: 3.3

## 2019-12-27 LAB — COMPREHENSIVE METABOLIC PANEL
Albumin: 4 (ref 3.5–5.0)
Calcium: 9.1 (ref 8.7–10.7)

## 2019-12-29 ENCOUNTER — Other Ambulatory Visit: Payer: Self-pay | Admitting: Pharmacist

## 2019-12-29 NOTE — Progress Notes (Signed)
PT STABLE AT TIME OF DISCHARGE 

## 2019-12-30 ENCOUNTER — Other Ambulatory Visit: Payer: Self-pay | Admitting: Hematology and Oncology

## 2019-12-30 ENCOUNTER — Other Ambulatory Visit: Payer: Self-pay

## 2019-12-30 DIAGNOSIS — C7952 Secondary malignant neoplasm of bone marrow: Secondary | ICD-10-CM

## 2019-12-30 DIAGNOSIS — C771 Secondary and unspecified malignant neoplasm of intrathoracic lymph nodes: Secondary | ICD-10-CM

## 2019-12-30 DIAGNOSIS — C50111 Malignant neoplasm of central portion of right female breast: Secondary | ICD-10-CM

## 2019-12-30 DIAGNOSIS — C7951 Secondary malignant neoplasm of bone: Secondary | ICD-10-CM

## 2019-12-30 DIAGNOSIS — F419 Anxiety disorder, unspecified: Secondary | ICD-10-CM

## 2019-12-30 MED ORDER — ALPRAZOLAM 1 MG PO TABS
1.0000 mg | ORAL_TABLET | Freq: Three times a day (TID) | ORAL | 2 refills | Status: DC | PRN
Start: 2019-12-30 — End: 2020-01-30

## 2020-01-01 ENCOUNTER — Other Ambulatory Visit: Payer: Self-pay | Admitting: Oncology

## 2020-01-02 ENCOUNTER — Telehealth: Payer: Self-pay | Admitting: Hematology and Oncology

## 2020-01-02 ENCOUNTER — Inpatient Hospital Stay: Payer: Medicare Other | Attending: Oncology | Admitting: Hematology and Oncology

## 2020-01-02 ENCOUNTER — Encounter: Payer: Self-pay | Admitting: Hematology and Oncology

## 2020-01-02 ENCOUNTER — Other Ambulatory Visit: Payer: Self-pay

## 2020-01-02 ENCOUNTER — Inpatient Hospital Stay: Payer: Medicare Other | Admitting: Hematology and Oncology

## 2020-01-02 ENCOUNTER — Ambulatory Visit: Payer: Medicare Other

## 2020-01-02 VITALS — BP 121/67 | HR 103 | Temp 98.6°F | Resp 18 | Ht 65.5 in | Wt 133.9 lb

## 2020-01-02 DIAGNOSIS — Z170 Estrogen receptor positive status [ER+]: Secondary | ICD-10-CM

## 2020-01-02 DIAGNOSIS — C50111 Malignant neoplasm of central portion of right female breast: Secondary | ICD-10-CM | POA: Insufficient documentation

## 2020-01-02 DIAGNOSIS — F418 Other specified anxiety disorders: Secondary | ICD-10-CM | POA: Diagnosis not present

## 2020-01-02 DIAGNOSIS — C779 Secondary and unspecified malignant neoplasm of lymph node, unspecified: Secondary | ICD-10-CM | POA: Insufficient documentation

## 2020-01-02 DIAGNOSIS — E039 Hypothyroidism, unspecified: Secondary | ICD-10-CM | POA: Diagnosis not present

## 2020-01-02 DIAGNOSIS — Z86000 Personal history of in-situ neoplasm of breast: Secondary | ICD-10-CM | POA: Diagnosis not present

## 2020-01-02 DIAGNOSIS — C7951 Secondary malignant neoplasm of bone: Secondary | ICD-10-CM

## 2020-01-02 DIAGNOSIS — Z79899 Other long term (current) drug therapy: Secondary | ICD-10-CM | POA: Insufficient documentation

## 2020-01-02 DIAGNOSIS — Z9011 Acquired absence of right breast and nipple: Secondary | ICD-10-CM | POA: Insufficient documentation

## 2020-01-02 DIAGNOSIS — E876 Hypokalemia: Secondary | ICD-10-CM | POA: Diagnosis not present

## 2020-01-02 DIAGNOSIS — R1310 Dysphagia, unspecified: Secondary | ICD-10-CM | POA: Insufficient documentation

## 2020-01-02 DIAGNOSIS — Z5111 Encounter for antineoplastic chemotherapy: Secondary | ICD-10-CM | POA: Insufficient documentation

## 2020-01-02 DIAGNOSIS — C7952 Secondary malignant neoplasm of bone marrow: Secondary | ICD-10-CM

## 2020-01-02 DIAGNOSIS — Z923 Personal history of irradiation: Secondary | ICD-10-CM | POA: Insufficient documentation

## 2020-01-02 LAB — CBC AND DIFFERENTIAL
HCT: 42 (ref 36–46)
HCT: 42 (ref 36–46)
Hemoglobin: 13.8 (ref 12.0–16.0)
Hemoglobin: 13.8 (ref 12.0–16.0)
Neutrophils Absolute: 3.75
Neutrophils Absolute: 3.75
Platelets: 241 (ref 150–399)
Platelets: 241 (ref 150–399)
WBC: 5
WBC: 5

## 2020-01-02 LAB — HEPATIC FUNCTION PANEL
ALT: 16 (ref 7–35)
ALT: 16 (ref 7–35)
AST: 24 (ref 13–35)
AST: 24 (ref 13–35)
Alkaline Phosphatase: 55 (ref 25–125)
Alkaline Phosphatase: 55 (ref 25–125)
Bilirubin, Total: 0.3
Bilirubin, Total: 0.3

## 2020-01-02 LAB — BASIC METABOLIC PANEL
BUN: 26 — AB (ref 4–21)
BUN: 26 — AB (ref 4–21)
CO2: 27 — AB (ref 13–22)
CO2: 27 — AB (ref 13–22)
Chloride: 102 (ref 99–108)
Chloride: 102 (ref 99–108)
Creatinine: 1.3 — AB (ref 0.5–1.1)
Creatinine: 1.3 — AB (ref 0.5–1.1)
Glucose: 87
Glucose: 87
Potassium: 3.6 (ref 3.4–5.3)
Potassium: 3.6 (ref 3.4–5.3)
Sodium: 137 (ref 137–147)
Sodium: 137 (ref 137–147)

## 2020-01-02 LAB — CBC
RBC: 4.58 (ref 3.87–5.11)
RBC: 4.58 (ref 3.87–5.11)

## 2020-01-02 LAB — COMPREHENSIVE METABOLIC PANEL
Albumin: 3.8 (ref 3.5–5.0)
Albumin: 3.8 (ref 3.5–5.0)
Calcium: 9.9 (ref 8.7–10.7)
Calcium: 9.9 (ref 8.7–10.7)

## 2020-01-02 NOTE — Progress Notes (Addendum)
[facility]  [address] [place],  [place]  [address] [phone]  Clinic Day:  01/02/2020  Referring physician: Ronita Hipps, MD   CHIEF COMPLAINT:  CC: Metastatic breast cancer  Current Treatment:   Fulvestrant and denosumab.   HISTORY OF PRESENT ILLNESS:  Stacy Park is a 64 y.o. female with a history of multiple breast cancers.  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  In February 1996, she developed a stage I hormone receptor positive right breast cancer, which was treated with lumpectomy, radiotherapy and tamoxifen for 5 years.  She had genetic testing for BRCA 1 and BRCA 2 at that time, which was apparently negative.  She has a very strong paternal family history, yet her father had completely negative genetic testing even though he had female breast cancer.  Her sister who died  due to metastatic breast cancer had a variant of uncertain significance of BRCA 2.  We began seeing her in April 2008, when she developed a second stage IIA (T2 N0 M0) hormone receptor positive right-sided breast cancer.  She was treated with a right modified radical mastectomy.  Pathology revealed a 2.8 cm, grade 2, invasive ductal carcinoma with lymphovascular invasion with negative nodes.  Estrogen and progesterone receptors were positive and her 2 Neu negative.  She received adjuvant chemotherapy consisting of 4 cycles of doxorubicin and cyclophosphamide, followed by 4 cycles of paclitaxel.  She was then placed on anastrozole 1 mg daily in December 2008.  Bone density scan was in January 2017 was normal.   She discontinued anastrozole after completing nearly 9 years of therapy, due to concerns over side effects on bones, as well as alendronate because of dental problems. She has had a TAH/BSO.   She developed a globus sensation of her throat in January 2020 and Dr.  Gaylyn Cheers only found mild edema of the arytenoid region, but no nodule was seen.  She continued to have a sensation of a lump in her throat and had an ultrasound of the thyroid which revealed 3 nodules within the left thyroid the largest measuring 1 cm, but this was densely calcified.  CT neck did show an abnormal lymph node mass in the mediastinum measuring 5 cm and encasing the left innominate vein.  There was also an enlarged right internal mammary lymph node measuring 22 mm and a cluster of hyperenhancing lymph nodes in the right lateral neck up to 8 mm in diameter with pathologic enhancement.  Biopsy of the neck node revealed metastatic breast cancer which was estrogen receptor positive and HER2 negative.  CT chest, abdomen and pelvis revealed metastases to the bone of the thoracic spine and pelvis, as well as right internal mammary and prevascular adenopathy.  MRI head was negative for intracranial metastasis.  Bone scan revealed metastasis to the T2, T3, T5 and T6 vertebral bodies, as well as the bilateral iliac bones and sacrum.  The patient was started on letrozole 2.5 mg daily, as well as palbociclib 125 mg daily for 3 weeks on and 1 week off in February of 2020.  She was also placed on denosumab injections monthly and received her 1st dose in February.  Denosumab was held in March due to low normal calcium of 8.4, despite taking calcium with vitamin-D supplement 3 times daily.  The hypocalcemia was felt to be most likely secondary to denosumab, so we increased  her calcium and vitamin-D supplement.  Parathyroid hormone was elevated.  In April 2020, she had an acute gastrointestinal illness, and worsening neutropenia, hypocalcemia and hypokalemia.  She was given IV fluids, potassium and calcium.   CT chest, abdomen and pelvis revealed a stable pre-vascular mass and unchanged bony metastasis. There was mild bowel wall thickening and inflammatory appearance of the duodenum and jejunum with enlarged lymph nodes in  the adjacent mesentery, which was felt to be consistent with nonspecific infectious or inflammatory enteritis. A stool specimen was positive for lactoferrin, but stool culture, Giardia and ova and parasites were negative.  Also, serology for H pylori was negative. She resumed palbociclib and denosumab on May 6th, after her liver function tests had returned to normal.  She was due for another cycle of palbociclib, as well as denosumab on June 3rd, but this was delayed due to neutropenia and hypocalcemia.  We had her increase her oral calcium again to 3000 mg 3 times per day.   Repeat CT imaging in August 2020 revealed a slight interval increase in size of the superior mediastinal soft tissue mass and increased mass effect on the central left brachiocephalic vein resulting in collateral vasculature within the mediastinum with appearing osseous sclerotic and lytic metastasis.  The CA27.29 had increased from 44.7 to 52.3 over the previous 6 months. Due to progressive disease, her therapy was changed to exemestane/everolimus, started on August 6th.  She had persistent hypocalcemia with a calcium of 8.2 at that time, so denosumab was held and calcitriol was added. CT brain was negative, and the anterior mediastinal mass measured 5.1 x 3.7 cm, previously 5.8 x 3.8 cm, which was stable.  She had worsening anemia and was found to have iron deficiency, for which she is on oral iron supplement daily. Her cancer marker had increased from 80 to 91, so we obtained CT chest, abdomen, and pelvis in February of 2021, which revealed a fairly stable infiltrative high right mediastinal mass measuring 6.4 x 4.1 cm, previously 6.1 x 4.0 cm.  The rest remained stable, but there was a new nonspecific irregular annular wall thickening involving the mid rectum. MRI chest in February revealed no substantial change in the known 5.9 x 3.6 cm anterior mediastinal soft tissue mass.  The lesion encases and attenuates the innominate veins, while  also encasing the brachiocephalic artery and left common carotid artery. She saw Dr. Orlene Erm for palliative radiation to the mediastinal mass, completed in March. Colonoscopy with Dr. Melina Copa revealed pathology with rare atypical cells consistent with carcinoma.  Immunohistochemistry was positive for cytokeratin AE1/AE3.  The mucosa is intact.  This does not appear to be a  primary malignancy of the rectum, so we feel it is consistent with metastatic breast cancer.   She was placed palliative IV chemotherapy with gemcitabine/vinorelbine every 2 weeks in April.  Unfortunately, she had a fall and sustained right ankle fracture.  She underwent surgical repair with plate and pins and was discharged home.  Then she developed severe constipation, felt to be secondary to narcotic medication and immobilization.  CT abdomen and pelvis in April revealed significant worsening of annular wall thickening and hyperenhancement throughout the rectum with worsening perirectal and presacral space fat stranding and ill-defined fluid.  There was new moderate right hydroureteronephrosis to the level of the lower right lumbar ureter, with surrounding fat stranding and ill-defined soft tissue in this location, suspicious for extrinsic ureteral obstruction. There were stable axial skeleton bone metastases.  The infiltrative high right mediastinal  mass showed some improvement. There appeared to be partial large bowel obstruction, and Dr. Coralie Keens discussed the options. She had a 2nd cycle of gemcitabine/vinorelbine in May, with dose of vinorelbine decreased by 25% due to the severe constipation. She then reported loose bowels about 10 times per day, so a diverting colostomy was performed in June.  Chemotherapy was placed on hold following the procedure and was resumed in June.  The CA 27-29 remained stable at 86.1 in June and 78.3 in July.  Left screening mammogram in July did not reveal any evidence of malignancy. CT chest, abdomen, and  pelvis in August revealing new clustered dense pleural nodules in the anterior medial left pleural space, largest 1.3 x 0.6 cm, suspicious for new pleural metastases.  CT neck revealed infiltrative tissue in the right thoracic inlet felt to be due to radiation changes.  The CA 27-29 increased to 97.6.  She opted to discontinue aggressive chemotherapy and was placed on endocrine therapy with fulvestrant, with denosumab every 4 weeks.  She had worsening anxiety requiring higher doses of lorazepam, up to 1-3 mg twice daily. She developed severe debilitating depression, so was placed on bupropion by Dr. Helene Kelp. CT head was negative.  The CA 27-29 remained fairly stable at 103.1 in September, 97.5 later in September and 107.2 in October.  INTERVAL HISTORY:  Kynli is seen today prior to fulvestrant and denosumab.  Overall, she continues to do fairly well.  She has had a mild increase in the dysphagia, mainly with large pills or large food bolus. She is having to cut her potassium chloride 10 mEq in half. She denies nausea or vomiting. She denies fevers or chills.  She  denies pain. Her appetite is good. Her weight has increased 4 pounds over last 4 weeks.  Her anxiety and depression are well controlled on her current medication.  She continues lorazepam 1 mg twice daily.  She states she has been doing more of her normal daily activities.  REVIEW OF SYSTEMS:  Review of Systems  Constitutional: Positive for fatigue (tires easily). Negative for appetite change, chills and fever.  HENT:   Positive for trouble swallowing (slightly increased). Negative for lump/mass, mouth sores and sore throat.   Eyes: Negative for icterus.  Respiratory: Negative for cough and shortness of breath.   Cardiovascular: Negative for chest pain and leg swelling.  Gastrointestinal: Negative for abdominal pain, constipation, diarrhea, nausea and vomiting.  Genitourinary: Negative for difficulty urinating, dysuria, frequency and hematuria.    Musculoskeletal: Negative for arthralgias, back pain and myalgias.  Skin: Negative for rash.  Neurological: Negative for dizziness, extremity weakness, headaches and numbness.  Psychiatric/Behavioral: Positive for depression (managed with medication). The patient is nervous/anxious (managed with medication).      VITALS:  There were no vitals taken for this visit.  Wt Readings from Last 3 Encounters:  12/05/09 129 lb 9 oz (58.8 kg)  12/07/19 129 lb 8 oz (58.7 kg)  06/05/19 147 lb (66.7 kg)    There is no height or weight on file to calculate BMI.  Performance status (ECOG): 1 - Symptomatic but completely ambulatory  PHYSICAL EXAM:  Physical Exam Vitals and nursing note reviewed.  Constitutional:      General: She is not in acute distress.    Appearance: She is normal weight.  HENT:     Mouth/Throat:     Mouth: Mucous membranes are moist.     Pharynx: Oropharynx is clear.  Eyes:     General: No scleral  icterus.    Extraocular Movements: Extraocular movements intact.     Conjunctiva/sclera: Conjunctivae normal.  Cardiovascular:     Rate and Rhythm: Normal rate and regular rhythm.     Heart sounds: Normal heart sounds. No murmur heard.  No friction rub. No gallop.   Pulmonary:     Effort: No respiratory distress.     Breath sounds: Normal breath sounds. No stridor. No wheezing, rhonchi or rales.  Abdominal:     General: There is no distension.     Palpations: Abdomen is soft. There is no mass.     Tenderness: There is no abdominal tenderness. There is no guarding.     Hernia: No hernia is present.  Musculoskeletal:     Cervical back: Rigidity (chronic fullness of the bilateral neck) present.     Right lower leg: No edema.     Left lower leg: No edema.  Lymphadenopathy:     Cervical: Cervical adenopathy present.     Right cervical: Deep cervical adenopathy (1cm, mid neck) present.     Left cervical: No superficial cervical adenopathy.     Upper Body:     Right upper  body: No supraclavicular or axillary adenopathy.     Left upper body: No supraclavicular or axillary adenopathy.     Lower Body: No right inguinal adenopathy. No left inguinal adenopathy.  Skin:    General: Skin is warm.     Coloration: Skin is not jaundiced.     Findings: No rash.  Neurological:     General: No focal deficit present.     Mental Status: She is alert and oriented to person, place, and time.     Cranial Nerves: No cranial nerve deficit.  Psychiatric:        Mood and Affect: Mood normal.        Behavior: Behavior normal.    LABS:     CA 27-29 is pending.  CBC Latest Ref Rng & Units 01/02/2020 01/02/2020 12/27/2019  WBC - 5.0 5.0 3.3  Hemoglobin 12.0 - 16.0 13.8 13.8 10.9(A)  Hematocrit 36 - 46 42 42 33(A)  Platelets 150 - 399 241 241 179   CMP Latest Ref Rng & Units 01/02/2020 01/02/2020 12/27/2019  Glucose 70 - 99 mg/dL - - -  BUN 4 - 21 26(A) 26(A) 10  Creatinine 0.5 - 1.1 1.3(A) 1.3(A) 0.7  Sodium 137 - 147 137 137 139  Potassium 3.4 - 5.3 3.6 3.6 3.6  Chloride 99 - 108 102 102 101  CO2 13 - 22 27(A) 27(A) 29(A)  Calcium 8.7 - 10.7 9.9 9.9 9.1  Total Protein 6.5 - 8.1 g/dL - - -  Total Bilirubin 0.3 - 1.2 mg/dL - - -  Alkaline Phos 25 - 125 55 55 61  AST 13 - 35 24 24 59(A)  ALT 7 - 35 _0 No results found for: CEA1 / No results found for: CEA1 No results found for: PSA1 No results found for: SJG283 No results found for: MOQ947  No results found for: TOTALPROTELP, ALBUMINELP, A1GS, A2GS, BETS, BETA2SER, GAMS, MSPIKE, SPEI No results found for: TIBC, FERRITIN, IRONPCTSAT No results found for: LDH  STUDIES:  No results found.    HISTORY:   Past Medical History:  Diagnosis Date  . Breast cancer ([facility])    S/P chemoradiation  . Cellulitis    Rt Ankle  . Hypothyroidism   . Malignant neoplasm of central portion of right female breast ([facility])   .  Mechanical complication of other vascular device, implant, and graft   . Plantar fasciitis    . Tenosynovitis of ankle     Past Surgical History:  Procedure Laterality Date  . ABDOMINAL HYSTERECTOMY    . centeral IV  age 32years old   with Tunneling and subcutan Port  . CESAREAN SECTION    . MASTECTOMY    . ORIF ANKLE FRACTURE Right 06/02/2019   Procedure: OPEN REDUCTION INTERNAL FIXATION RIGHT ANKLE FRACTURE;  Surgeon: Shona Needles, MD;  Location: [facility];  Service: Orthopedics;  Laterality: Right;  . PORT-A-CATH REMOVAL  06/2010   Dr Melynda Ripple  . transcatheter foreign body retrieval  08/20/2010, Dr Dyke Maes   retained catheter fragment in ri heart related to a severed Port A Cath    History reviewed. No pertinent family history.  Social History:  reports that she has never smoked. She has never used smokeless tobacco. She reports that she does not drink alcohol and does not use drugs.The patient is accompanied by  her daughter-in-law, Stacy Park, today.  Allergies:  Allergies  Allergen Reactions  . Latex Rash    Per pt "tears skin"  . Penicillins Rash    Current Medications: Current Outpatient Medications  Medication Sig Dispense Refill  . acetaminophen (TYLENOL) 500 MG tablet Take 1,000 mg by mouth every 6 (six) hours as needed for mild pain or headache.    . ALPRAZolam (XANAX) 1 MG tablet Take 1 tablet (1 mg total) by mouth 3 (three) times daily as needed for anxiety. 90 tablet 2  . buPROPion (WELLBUTRIN) 100 MG tablet Take 100 mg by mouth 2 (two) times daily.    . calcitRIOL (ROCALTROL) 0.25 MCG capsule Take 0.25 mcg by mouth daily.    Marland Kitchen CALCIUM CARBONATE-VIT D-MIN PO Take 12,000 mg by mouth daily.    . Denosumab (XGEVA [place]) Inject into the skin.    . ferrous sulfate 325 (65 FE) MG tablet Take 325 mg by mouth daily with breakfast.    . levothyroxine (SYNTHROID, LEVOTHROID) 50 MCG tablet Take 50 mcg by mouth daily.      . ondansetron (ZOFRAN) 4 MG tablet Take 4 mg by mouth every 4 (four) hours as needed for nausea/vomiting.    . Probiotic Product (PROBIOTIC-10  PO) Take 1 capsule by mouth daily.    . prochlorperazine (COMPAZINE) 10 MG tablet Take 10 mg by mouth every 6 (six) hours as needed for nausea/vomiting.    . promethazine (PHENERGAN) 25 MG tablet Take 1 tablet (25 mg total) by mouth every 6 (six) hours as needed for nausea or vomiting. 10 tablet 0  . sertraline (ZOLOFT) 100 MG tablet Take 100 mg by mouth daily.    . traZODone (DESYREL) 150 MG tablet Take 75 mg by mouth at bedtime.      No current facility-administered medications for this visit.     ASSESSMENT & PLAN:   Assessment/Plan: 1. Recurrent hormone receptor positive breast cancer with metastasis to lymph nodes and bone, for which she is on palliative exemestane/everolimus after progression of disease on letrozole/palbociclib, felt to be basically stable by the radiologist on CT imaging.  Scans from early February 2021 showed mild worsening, so we referred her to Dr. Orlene Erm and she completed radiation to the mediastinal mass in March.  She was receiving palliative gemcitabine/vinorelbine.  The dose of vinorelbine was reduced by 25% with the 2nd cycle due to severe constipation, as well as mild neutropenia.  She had tolerated chemotherapy fairly well, but this  was discontinued this due to progressive disease.  She is now receiving fulvestrant injections, as she did not desire further aggressive chemotherapy.  She will continue fulvestrant monthly, as well denosumab for the bone metastasis. 2. 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 3. History of stage I right breast cancer February 1996 treated with surgery, radiation and 5 years of tamoxifen. 4. History of IIA right breast cancer April 2008 treated with surgery, chemotherapy, and hormonal therapy. 5. Hypocalcemia, which resolved with the addition of calcitriol to her calcium 3000 mg 4 times daily, as well as discontinuing omeprazole. She knows to continue this calcium  supplement.   6. Bilateral mild upper extremity lymphedema. 7. 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  As this developed on treatment with exemestane/everolimus, she was switched to IV chemotherapy with gemcitabine/vinorelbine.  She had increased nausea, vomiting and abdominal pain.  CT imaging was obtained in April revealed worsening rectal tumor with partial large bowel obstruction.  This was causing her much distress, as well as skin irritation.  She therefore underwent diverting colostomy in June and tolerated this procedure without difficulty.  Her colostomy is functioning well. 8. Anxiety and depression, improved with bupropion, in addition to lorazepam 1 mg twice daily. 9. Hypokalemia, resolved with potassium chloride 10 mEq twice daily.  She knows to continue this. 10. Worsening dysphagia, I contacted Dr. Carmie End office for his opinion on esophageal dilatation, since the patient had previous radiation , and she will see him tomorrow for further discussion.  She will proceed with fulvestrant and denosumab on November 24th.  We will plan to see her back in 4 weeks with a CBC, comprehensive metabolic panel and CA 15-61 prior to her next fulvestrant and denosumab.  The patient and her daughter-in-law understand the plans discussed today and are in agreement with them.  They know to contact our office if they develop concerns prior to her next appointment.   Marvia Pickles, PA-C    Addendum:  Vital signs today include a weight of 133.9 lb, which is an increase of 4 lb, blood pressure 121/67, pulse 103, respirations 18 and temperature 98.6. Pulse oximetry is 99% on room air.

## 2020-01-02 NOTE — Telephone Encounter (Signed)
Per 11/22 LOS Patient schedule for 12/20 Labs, Follow up with Vida Roller (Dr Hinton Rao on [place]) - 12/22 Injection.  Patient give Appt Summary

## 2020-01-03 LAB — CANCER ANTIGEN 27.29: CA 27.29: 106.6 U/mL — ABNORMAL HIGH (ref 0.0–38.6)

## 2020-01-04 ENCOUNTER — Other Ambulatory Visit: Payer: Self-pay

## 2020-01-04 ENCOUNTER — Inpatient Hospital Stay: Payer: Medicare Other

## 2020-01-04 ENCOUNTER — Other Ambulatory Visit: Payer: Medicare Other

## 2020-01-04 ENCOUNTER — Telehealth: Payer: Self-pay | Admitting: Hematology and Oncology

## 2020-01-04 VITALS — BP 116/71 | HR 85 | Temp 98.2°F | Resp 18 | Ht 65.5 in | Wt 134.2 lb

## 2020-01-04 DIAGNOSIS — C50111 Malignant neoplasm of central portion of right female breast: Secondary | ICD-10-CM

## 2020-01-04 DIAGNOSIS — C7951 Secondary malignant neoplasm of bone: Secondary | ICD-10-CM

## 2020-01-04 DIAGNOSIS — C7952 Secondary malignant neoplasm of bone marrow: Secondary | ICD-10-CM

## 2020-01-04 DIAGNOSIS — Z170 Estrogen receptor positive status [ER+]: Secondary | ICD-10-CM

## 2020-01-04 DIAGNOSIS — Z5111 Encounter for antineoplastic chemotherapy: Secondary | ICD-10-CM | POA: Diagnosis not present

## 2020-01-04 MED ORDER — DENOSUMAB 120 MG/1.7ML ~~LOC~~ SOLN
SUBCUTANEOUS | Status: AC
  Filled 2020-01-04: qty 1.7

## 2020-01-04 MED ORDER — DENOSUMAB 120 MG/1.7ML ~~LOC~~ SOLN
120.0000 mg | Freq: Once | SUBCUTANEOUS | Status: AC
  Administered 2020-01-04: 120 mg via SUBCUTANEOUS

## 2020-01-04 MED ORDER — FULVESTRANT 250 MG/5ML IM SOLN
INTRAMUSCULAR | Status: AC
  Filled 2020-01-04: qty 10

## 2020-01-04 MED ORDER — FULVESTRANT 250 MG/5ML IM SOLN
500.0000 mg | Freq: Once | INTRAMUSCULAR | Status: AC
  Administered 2020-01-04: 500 mg via INTRAMUSCULAR

## 2020-01-04 NOTE — Progress Notes (Signed)
PT STABLE AT TIME OF DISCHARGE 

## 2020-01-04 NOTE — Patient Instructions (Signed)
Denosumab injection What is this medicine? DENOSUMAB (den oh sue mab) slows bone breakdown. Prolia is used to treat osteoporosis in women after menopause and in men, and in people who are taking corticosteroids for 6 months or more. Xgeva is used to treat a high calcium level due to cancer and to prevent bone fractures and other bone problems caused by multiple myeloma or cancer bone metastases. Xgeva is also used to treat giant cell tumor of the bone. This medicine may be used for other purposes; ask your health care provider or pharmacist if you have questions. COMMON BRAND NAME(S): Prolia, XGEVA What should I tell my health care provider before I take this medicine? They need to know if you have any of these conditions:  dental disease  having surgery or tooth extraction  infection  kidney disease  low levels of calcium or Vitamin D in the blood  malnutrition  on hemodialysis  skin conditions or sensitivity  thyroid or parathyroid disease  an unusual reaction to denosumab, other medicines, foods, dyes, or preservatives  pregnant or trying to get pregnant  breast-feeding How should I use this medicine? This medicine is for injection under the skin. It is given by a health care professional in a hospital or clinic setting. A special MedGuide will be given to you before each treatment. Be sure to read this information carefully each time. For Prolia, talk to your pediatrician regarding the use of this medicine in children. Special care may be needed. For Xgeva, talk to your pediatrician regarding the use of this medicine in children. While this drug may be prescribed for children as young as 13 years for selected conditions, precautions do apply. Overdosage: If you think you have taken too much of this medicine contact a poison control center or emergency room at once. NOTE: This medicine is only for you. Do not share this medicine with others. What if I miss a dose? It is  important not to miss your dose. Call your doctor or health care professional if you are unable to keep an appointment. What may interact with this medicine? Do not take this medicine with any of the following medications:  other medicines containing denosumab This medicine may also interact with the following medications:  medicines that lower your chance of fighting infection  steroid medicines like prednisone or cortisone This list may not describe all possible interactions. Give your health care provider a list of all the medicines, herbs, non-prescription drugs, or dietary supplements you use. Also tell them if you smoke, drink alcohol, or use illegal drugs. Some items may interact with your medicine. What should I watch for while using this medicine? Visit your doctor or health care professional for regular checks on your progress. Your doctor or health care professional may order blood tests and other tests to see how you are doing. Call your doctor or health care professional for advice if you get a fever, chills or sore throat, or other symptoms of a cold or flu. Do not treat yourself. This drug may decrease your body's ability to fight infection. Try to avoid being around people who are sick. You should make sure you get enough calcium and vitamin D while you are taking this medicine, unless your doctor tells you not to. Discuss the foods you eat and the vitamins you take with your health care professional. See your dentist regularly. Brush and floss your teeth as directed. Before you have any dental work done, tell your dentist you are   receiving this medicine. Do not become pregnant while taking this medicine or for 5 months after stopping it. Talk with your doctor or health care professional about your birth control options while taking this medicine. Women should inform their doctor if they wish to become pregnant or think they might be pregnant. There is a potential for serious side  effects to an unborn child. Talk to your health care professional or pharmacist for more information. What side effects may I notice from receiving this medicine? Side effects that you should report to your doctor or health care professional as soon as possible:  allergic reactions like skin rash, itching or hives, swelling of the face, lips, or tongue  bone pain  breathing problems  dizziness  jaw pain, especially after dental work  redness, blistering, peeling of the skin  signs and symptoms of infection like fever or chills; cough; sore throat; pain or trouble passing urine  signs of low calcium like fast heartbeat, muscle cramps or muscle pain; pain, tingling, numbness in the hands or feet; seizures  unusual bleeding or bruising  unusually weak or tired Side effects that usually do not require medical attention (report to your doctor or health care professional if they continue or are bothersome):  constipation  diarrhea  headache  joint pain  loss of appetite  muscle pain  runny nose  tiredness  upset stomach This list may not describe all possible side effects. Call your doctor for medical advice about side effects. You may report side effects to FDA at 1-800-FDA-1088. Where should I keep my medicine? This medicine is only given in a clinic, doctor's office, or other health care setting and will not be stored at home. NOTE: This sheet is a summary. It may not cover all possible information. If you have questions about this medicine, talk to your doctor, pharmacist, or health care provider.  2020 [employer] (2017-06-05 16:10:44) Fulvestrant injection What is this medicine? FULVESTRANT (ful VES trant) blocks the effects of estrogen. It is used to treat breast cancer. This medicine may be used for other purposes; ask your health care provider or pharmacist if you have questions. COMMON BRAND NAME(S): FASLODEX What should I tell my health care  provider before I take this medicine? They need to know if you have any of these conditions:  bleeding disorders  liver disease  low blood counts, like low white cell, platelet, or red cell counts  an unusual or allergic reaction to fulvestrant, other medicines, foods, dyes, or preservatives  pregnant or trying to get pregnant  breast-feeding How should I use this medicine? This medicine is for injection into a muscle. It is usually given by a health care professional in a hospital or clinic setting. Talk to your pediatrician regarding the use of this medicine in children. Special care may be needed. Overdosage: If you think you have taken too much of this medicine contact a poison control center or emergency room at once. NOTE: This medicine is only for you. Do not share this medicine with others. What if I miss a dose? It is important not to miss your dose. Call your doctor or health care professional if you are unable to keep an appointment. What may interact with this medicine?  medicines that treat or prevent blood clots like warfarin, enoxaparin, dalteparin, apixaban, dabigatran, and rivaroxaban This list may not describe all possible interactions. Give your health care provider a list of all the medicines, herbs, non-prescription drugs, or dietary supplements you use.   Also tell them if you smoke, drink alcohol, or use illegal drugs. Some items may interact with your medicine. What should I watch for while using this medicine? Your condition will be monitored carefully while you are receiving this medicine. You will need important blood work done while you are taking this medicine. Do not become pregnant while taking this medicine or for at least 1 year after stopping it. Women of child-bearing potential will need to have a negative pregnancy test before starting this medicine. Women should inform their doctor if they wish to become pregnant or think they might be pregnant. There is  a potential for serious side effects to an unborn child. Men should inform their doctors if they wish to father a child. This medicine may lower sperm counts. Talk to your health care professional or pharmacist for more information. Do not breast-feed an infant while taking this medicine or for 1 year after the last dose. What side effects may I notice from receiving this medicine? Side effects that you should report to your doctor or health care professional as soon as possible:  allergic reactions like skin rash, itching or hives, swelling of the face, lips, or tongue  feeling faint or lightheaded, falls  pain, tingling, numbness, or weakness in the legs  signs and symptoms of infection like fever or chills; cough; flu-like symptoms; sore throat  vaginal bleeding Side effects that usually do not require medical attention (report to your doctor or health care professional if they continue or are bothersome):  aches, pains  constipation  diarrhea  headache  hot flashes  nausea, vomiting  pain at site where injected  stomach pain This list may not describe all possible side effects. Call your doctor for medical advice about side effects. You may report side effects to FDA at 1-800-FDA-1088. Where should I keep my medicine? This drug is given in a hospital or clinic and will not be stored at home. NOTE: This sheet is a summary. It may not cover all possible information. If you have questions about this medicine, talk to your doctor, pharmacist, or health care provider.  2020 [employer] Standard (2017-05-07 11:34:41)  

## 2020-01-04 NOTE — Telephone Encounter (Signed)
Received phone call from Dr. Melina Copa today. He saw patient yesterday. He did not feel patient would benefit from esophageal dilatation as he feels swelling in neck is cause of dysphagia. The patient reports that applying pressure in this area causes increased symptoms and he feels if he attempted, would increase her discomfort. He advised the patient and her son to manage with changing diet to softer foods.   I telephoned patient to discuss above. She verbalizes understanding.

## 2020-01-26 ENCOUNTER — Other Ambulatory Visit: Payer: Self-pay | Admitting: Hematology and Oncology

## 2020-01-26 DIAGNOSIS — E876 Hypokalemia: Secondary | ICD-10-CM

## 2020-01-26 NOTE — Progress Notes (Signed)
PT STABLE AT TIME OF DISCHARGE 

## 2020-01-27 NOTE — Progress Notes (Signed)
Stacy Park  [address] [place],  [place]  [id number] [phone]  Clinic Day:  01/31/2020  Referring physician: Ronita Hipps, MD   CHIEF COMPLAINT:  CC: Metastatic breast cancer  Current Treatment:   Fulvestrant and denosumab.   HISTORY OF PRESENT ILLNESS:  Stacy Park is a 64 y.o. female with a history of multiple breast cancers.  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  In February 1996, she developed a stage I hormone receptor positive right breast cancer, which was treated with lumpectomy, radiotherapy and tamoxifen for 5 years.  She had genetic testing for BRCA 1 and BRCA 2 at that time, which was apparently negative.  She has a very strong paternal family history, yet her father had completely negative genetic testing even though he had female breast cancer.  Her sister who died  due to metastatic breast cancer had a variant of uncertain significance of BRCA 2.  We began seeing her in April 2008, when she developed a second stage IIA (T2 N0 M0) hormone receptor positive right-sided breast cancer.  She was treated with a right modified radical mastectomy.  Pathology revealed a 2.8 cm, grade 2, invasive ductal carcinoma with lymphovascular invasion with negative nodes.  Estrogen and progesterone receptors were positive and her 2 Neu negative.  She received adjuvant chemotherapy consisting of 4 cycles of doxorubicin and cyclophosphamide, followed by 4 cycles of paclitaxel.  She was then placed on anastrozole 1 mg daily in December 2008.  Bone density scan was in January 2017 was normal.   She discontinued anastrozole after completing nearly 9 years of therapy, due to concerns over side effects on bones, as well as alendronate because of dental problems. She has had a TAH/BSO.   She developed a globus sensation of her throat in January 2020 and Dr.  Gaylyn Cheers only found mild edema of the arytenoid region, but no nodule was seen.  She continued to have a sensation of a lump in her throat and had an ultrasound of the thyroid which revealed 3 nodules within the left thyroid the largest measuring 1 cm, but this was densely calcified.  CT neck did show an abnormal lymph node mass in the mediastinum measuring 5 cm and encasing the left innominate vein.  There was also an enlarged right internal mammary lymph node measuring 22 mm and a cluster of hyperenhancing lymph nodes in the right lateral neck up to 8 mm in diameter with pathologic enhancement.  Biopsy of the neck node revealed metastatic breast cancer which was estrogen receptor positive and HER2 negative.  CT chest, abdomen and pelvis revealed metastases to the bone of the thoracic spine and pelvis, as well as right internal mammary and prevascular adenopathy.  MRI head was negative for intracranial metastasis.  Bone scan revealed metastasis to the T2, T3, T5 and T6 vertebral bodies, as well as the bilateral iliac bones and sacrum.  The patient was started on letrozole 2.5 mg daily, as well as palbociclib 125 mg daily for 3 weeks on and 1 week off in February of 2020.  She was also placed on denosumab injections monthly and received her 1st dose in February.  Denosumab was held in March due to low normal calcium of 8.4, despite taking calcium with vitamin-D supplement 3 times daily.  The hypocalcemia was felt to be most likely secondary to denosumab, so we increased  her calcium and vitamin-D supplement.  Parathyroid hormone was elevated.  In April 2020, she had an acute gastrointestinal illness, and worsening neutropenia, hypocalcemia and hypokalemia.  She was given IV fluids, potassium and calcium.   CT chest, abdomen and pelvis revealed a stable pre-vascular mass and unchanged bony metastasis. There was mild bowel wall thickening and inflammatory appearance of the duodenum and jejunum with enlarged lymph nodes in  the adjacent mesentery, which was felt to be consistent with nonspecific infectious or inflammatory enteritis. A stool specimen was positive for lactoferrin, but stool culture, Giardia and ova and parasites were negative.  Also, serology for H pylori was negative. She resumed palbociclib and denosumab on May 6th, after her liver function tests had returned to normal.  She was due for another cycle of palbociclib, as well as denosumab on June 3rd, but this was delayed due to neutropenia and hypocalcemia.  We had her increase her oral calcium again to 3000 mg 3 times per day.   Repeat CT imaging in August 2020 revealed a slight interval increase in size of the superior mediastinal soft tissue mass and increased mass effect on the central left brachiocephalic vein resulting in collateral vasculature within the mediastinum with appearing osseous sclerotic and lytic metastasis.  The CA27.29 had increased from 44.7 to 52.3 over the previous 6 months. Due to progressive disease, her therapy was changed to exemestane/everolimus, started on August 6th.  She had persistent hypocalcemia with a calcium of 8.2 at that time, so denosumab was held and calcitriol was added. CT brain was negative, and the anterior mediastinal mass measured 5.1 x 3.7 cm, previously 5.8 x 3.8 cm, which was stable.  She had worsening anemia and was found to have iron deficiency, for which she is on oral iron supplement daily. Her cancer marker had increased from 80 to 91, so we obtained CT chest, abdomen, and pelvis in February of 2021, which revealed a fairly stable infiltrative high right mediastinal mass measuring 6.4 x 4.1 cm, previously 6.1 x 4.0 cm.  The rest remained stable, but there was a new nonspecific irregular annular wall thickening involving the mid rectum. MRI chest in February revealed no substantial change in the known 5.9 x 3.6 cm anterior mediastinal soft tissue mass.  The lesion encases and attenuates the innominate veins, while  also encasing the brachiocephalic artery and left common carotid artery. She saw Dr. Orlene Erm for palliative radiation to the mediastinal mass, completed in March. Colonoscopy with Dr. Melina Copa revealed pathology with rare atypical cells consistent with carcinoma.  Immunohistochemistry was positive for cytokeratin AE1/AE3.  The mucosa is intact.  This does not appear to be a  primary malignancy of the rectum, so we feel it is consistent with metastatic breast cancer.   She was placed palliative IV chemotherapy with gemcitabine/vinorelbine every 2 weeks in April.  Unfortunately, she had a fall and sustained right ankle fracture.  She underwent surgical repair with plate and pins and was discharged home.  Then she developed severe constipation, felt to be secondary to narcotic medication and immobilization.  CT abdomen and pelvis in April revealed significant worsening of annular wall thickening and hyperenhancement throughout the rectum with worsening perirectal and presacral space fat stranding and ill-defined fluid.  There was new moderate right hydroureteronephrosis to the level of the lower right lumbar ureter, with surrounding fat stranding and ill-defined soft tissue in this location, suspicious for extrinsic ureteral obstruction. There were stable axial skeleton bone metastases.  The infiltrative high right mediastinal  mass showed some improvement. There appeared to be partial large bowel obstruction, and Dr. Coralie Keens discussed the options. She had a 2nd cycle of gemcitabine/vinorelbine in May, with dose of vinorelbine decreased by 25% due to the severe constipation. She then reported loose bowels about 10 times per day, so a diverting colostomy was performed in June.  Chemotherapy was placed on hold following the procedure and was resumed in June.  The CA 27-29 remained stable at 86.1 in June and 78.3 in July.  Left screening mammogram in July did not reveal any evidence of malignancy. CT chest, abdomen, and  pelvis in August revealing new clustered dense pleural nodules in the anterior medial left pleural space, largest 1.3 x 0.6 cm, suspicious for new pleural metastases.  CT neck revealed infiltrative tissue in the right thoracic inlet felt to be due to radiation changes.  The CA 27-29 increased to 97.6.  She opted to discontinue aggressive chemotherapy and was placed on endocrine therapy with fulvestrant, with denosumab every 4 weeks.  She had worsening anxiety requiring higher doses of lorazepam, up to 1-3 mg twice daily. She developed severe debilitating depression, so was placed on bupropion by Dr. Helene Kelp. CT head was negative.  The CA 27-29 remained fairly stable at 103.1 in September, 97.5 later in September and 107.2 in October.  INTERVAL HISTORY:  Kazzandra is seen today prior to fulvestrant and denosumab.   She saw Dr. Melina Copa last month and he did not feel her dysphagia was related to esophageal narrowing and attributed to the neck swelling.   Since her last visit, she has had increased difficulty swallowing, so she has to crush all her medications now and is eating soft foods to avoid choking.  She also states that she has been having more trouble breathing with doing simple chores, such as sweeping or dishes.  States the dyspnea resolves with rest.  She denies cough or chest pain.  She denies pain elsewhere.  She denies fevers or chills.  Her appetite is good, but she is not able to eat as she did previously due to the dysphagia. Her weight has decreased 5 pounds over last 4 weeks.  Her anxiety and depression are fairly well controlled on her current medication.  She continues lorazepam 1 mg twice daily.  She would like to undergo further imaging for evaluation, but she is still not sure whether she wishes to consider further aggressive chemotherapy.  REVIEW OF SYSTEMS:  Review of Systems  Constitutional: Positive for appetite change, fatigue and unexpected weight change. Negative for chills and fever.   HENT:   Positive for lump/mass and trouble swallowing. Negative for mouth sores and sore throat.   Respiratory: Negative for cough and shortness of breath.   Cardiovascular: Negative for chest pain and leg swelling.  Gastrointestinal: Negative for abdominal distention, abdominal pain, blood in stool, constipation, diarrhea and nausea.  Genitourinary: Negative for difficulty urinating, dysuria, frequency and hematuria.   Musculoskeletal: Negative for arthralgias, back pain and myalgias.  Neurological: Negative for dizziness and headaches.  Psychiatric/Behavioral: Positive for depression. The patient is nervous/anxious.      VITALS:  Blood pressure 120/65, pulse (!) 107, temperature 98.1 F (36.7 C), temperature source Oral, resp. rate 18, height 5' 5.5" (1.664 m), weight 129 lb 9.6 oz (58.8 kg), SpO2 97 %.  Pulse oximetry dropped to 86% with continued walking.  Wt Readings from Last 3 Encounters:  01/30/20 129 lb 9.6 oz (58.8 kg)  12/06/19 129 lb 9 oz (58.8 kg)  01/04/20 134 lb 4 oz (60.9 kg)    Body mass index is 21.24 kg/m.  Performance status (ECOG): 1 - Symptomatic but completely ambulatory  PHYSICAL EXAM:  Physical Exam Vitals and nursing note reviewed.  Constitutional:      General: She is not in acute distress.    Appearance: She is normal weight.  HENT:     Mouth/Throat:     Mouth: Mucous membranes are moist.     Pharynx: Oropharynx is clear.  Eyes:     General: No scleral icterus.    Extraocular Movements: Extraocular movements intact.     Conjunctiva/sclera: Conjunctivae normal.  Neck:     Comments: There is persistent bilateral neck fullness, induration and tenderness without discrete mass. Cardiovascular:     Rate and Rhythm: Normal rate and regular rhythm.     Heart sounds: Normal heart sounds. No murmur heard. No friction rub. No gallop.   Pulmonary:     Effort: No respiratory distress.     Breath sounds: Decreased air movement (Decreased breath sounds  left lower lobe) present. No stridor. No wheezing, rhonchi or rales.  Chest:  Breasts:     Right: No axillary adenopathy or supraclavicular adenopathy.     Left: No axillary adenopathy or supraclavicular adenopathy.      Comments: Chest wall exam is deferred. Abdominal:     General: There is no distension.     Palpations: Abdomen is soft. There is no hepatomegaly, splenomegaly or mass.     Tenderness: There is no abdominal tenderness. There is no guarding.     Hernia: No hernia is present.  Musculoskeletal:     Cervical back: Rigidity (chronic fullness of the bilateral neck) present. No crepitus. Muscular tenderness present. Decreased range of motion.     Right lower leg: No edema.     Left lower leg: No edema.  Lymphadenopathy:     Cervical: No cervical adenopathy.     Right cervical: Deep cervical adenopathy: 1cm, mid neck.     Upper Body:     Right upper body: No supraclavicular or axillary adenopathy.     Left upper body: No supraclavicular or axillary adenopathy.     Lower Body: No right inguinal adenopathy. No left inguinal adenopathy.  Skin:    General: Skin is warm.     Coloration: Skin is not jaundiced.     Findings: No rash.  Neurological:     General: No focal deficit present.     Mental Status: She is alert and oriented to person, place, and time.     Cranial Nerves: No cranial nerve deficit.  Psychiatric:        Mood and Affect: Mood normal.        Behavior: Behavior normal.   Lymph nodes:   There is no cervical, clavicular, axillary or inguinal lymphadenopathy.  LABS:    CBC Latest Ref Rng & Units 01/30/2020 01/02/2020 01/02/2020  WBC - 6.2 5.0 5.0  Hemoglobin 12.0 - 16.0 14.9 13.8 13.8  Hematocrit 36 - 46 45 42 42  Platelets 150 - 399 274 241 241   CMP Latest Ref Rng & Units 01/30/2020 01/02/2020 01/02/2020  Glucose 70 - 99 mg/dL - - -  BUN 4 - 21 21 26(A) 26(A)  Creatinine 0.5 - 1.1 1.4(A) 1.3(A) 1.3(A)  Sodium 137 - 147 137 137 137  Potassium 3.4 -  5.3 4.0 3.6 3.6  Chloride 99 - 108 102 102 102  CO2 13 - 22 27(A) 27(A) 27(A)  Calcium 8.7 - 10.7 10.2 9.9 9.9  Total Protein 6.5 - 8.1 g/dL - - -  Total Bilirubin 0.3 - 1.2 mg/dL - - -  Alkaline Phos 25 - 125 60 55 55  AST 13 - 35 _0 ALT 7 - 35 _1 CA 27-29 has increased slightly from 107.2 to 113.7.  No results found for: CEA1 / No results found for: CEA1 No results found for: PSA1 No results found for: YQM578 No results found for: CAN125  No results found for: TOTALPROTELP, ALBUMINELP, A1GS, A2GS, BETS, BETA2SER, GAMS, MSPIKE, SPEI No results found for: TIBC, FERRITIN, IRONPCTSAT No results found for: LDH  STUDIES:    Exam(s): [id number] CT/CT ANGIO CHEST CLINICAL DATA:  Breast cancer.  Neck swelling.  EXAM: CT ANGIOGRAPHY CHEST WITH CONTRAST  TECHNIQUE: Multidetector CT imaging of the chest was performed using the standard protocol during bolus administration of intravenous contrast. Multiplanar CT image reconstructions and MIPs were obtained to evaluate the vascular anatomy.  CONTRAST:  100 cc Isovue 370  COMPARISON:  11/07/2019  FINDINGS: Cardiovascular: The heart size is normal. No substantial pericardial effusion. Coronary artery calcification is evident. Atherosclerotic calcification is noted in the wall of the thoracic aorta. Right Port-A-Cath tip is positioned in the distal SVC. Similar appearance of small pleural effusion. Due to bolus timing, systemic venous anatomy of the thoracic inlets is not well demonstrated nor is the SVC. There is no filling defect within the opacified pulmonary arteries to suggest the presence of an acute pulmonary embolus.  Mediastinum/Nodes: Ill-defined soft tissue attenuation anterior mediastinum is similar to prior. This extends up into the right thoracic inlet. There is no hilar lymphadenopathy. The esophagus has normal imaging features. There is no axillary lymphadenopathy.  Lungs/Pleura: Mosaic attenuation  noted in both lungs, nonspecific, but potentially related to air trapping. Paramediastinal scarring in the right upper lobe is stable as is subpleural reticulation in the anterior right lung. Small bilateral pleural effusions, left greater than right, are new in the interval. The ill-defined airspace disease posterior left lower lobe seen on the previous study is not well demonstrated today, potentially related to volume loss and pleural fluid.  Upper Abdomen: Atrophy/scarring noted upper pole left kidney.  Musculoskeletal: Sclerotic and lytic bone lesions are again noted in the thoracic spine including left T5 lesion with suggestion of epidural tumor involvement.  Review of the MIP images confirms the above findings.  IMPRESSION: 1. No CT evidence for acute pulmonary embolus. 2. Interval development of small bilateral pleural effusions, left greater than right. 3. The ill-defined airspace disease seen on the previous study in the posterior left lower lobe is not well demonstrated today, probably related to resolution of potentially related to volume loss and pleural fluid. 4. Ill-defined soft tissue attenuation anterior mediastinum extending into the right thoracic inlet is not substantially changed in the interval. 5. Similar appearance of sclerotic and lytic bone lesions in the thoracic spine including left T5 lesion with suggestion of epidural tumor involvement. 6. Aortic Atherosclerosis (ICD10-I70.0).   Electronically Signed   By: Misty Stanley M.D.   On: 01/30/2020 12:19   HISTORY:   Past Medical History:  Diagnosis Date  . Breast cancer ([place])    S/P chemoradiation  . Cellulitis    Rt Ankle  . Hypothyroidism   . Malignant neoplasm of central portion of right female breast ([place])   . Mechanical complication of other vascular device, implant, and graft   . Plantar fasciitis   .  Tenosynovitis of ankle     Past Surgical History:  Procedure Laterality Date  .  ABDOMINAL HYSTERECTOMY    . centeral IV  age 20years old   with Tunneling and subcutan Port  . CESAREAN SECTION    . MASTECTOMY    . ORIF ANKLE FRACTURE Right 06/02/2019   Procedure: OPEN REDUCTION INTERNAL FIXATION RIGHT ANKLE FRACTURE;  Surgeon: Shona Needles, MD;  Location: [facility];  Service: Orthopedics;  Laterality: Right;  . PORT-A-CATH REMOVAL  06/2010   Dr Melynda Ripple  . transcatheter foreign body retrieval  08/20/2010, Dr Dyke Maes   retained catheter fragment in ri heart related to a severed Port A Cath    History reviewed. No pertinent family history.  Social History:  reports that she has never smoked. She has never used smokeless tobacco. She reports that she does not drink alcohol and does not use drugs.The patient is accompanied by her son and daughter-in-law today.  Allergies:  Allergies  Allergen Reactions  . Latex Rash    Per pt "tears skin"  . Penicillins Rash    Current Medications: Current Outpatient Medications  Medication Sig Dispense Refill  . buPROPion (WELLBUTRIN XL) 150 MG 24 hr tablet     . calcitRIOL (ROCALTROL) 0.25 MCG capsule Take 0.25 mcg by mouth daily.    Marland Kitchen CALCIUM CARBONATE-VIT D-MIN PO Take 9,600 mg by mouth daily. 9676m daily    . Denosumab (XGEVA [place]) Inject into the skin.    .Marland Kitchenlevothyroxine (SYNTHROID, LEVOTHROID) 50 MCG tablet Take 50 mcg by mouth daily.    .Marland KitchenLORazepam (ATIVAN) 1 MG tablet Take 1 mg by mouth 2 (two) times daily.    . ondansetron (ZOFRAN) 4 MG tablet Take 4 mg by mouth every 4 (four) hours as needed for nausea/vomiting.    . potassium chloride (KLOR-CON) 10 MEQ tablet TAKE 1 TABLET TWICE DAILY 180 tablet 0  . Probiotic Product (PROBIOTIC-10 PO) Take 1 capsule by mouth daily.    . Sertraline HCl 150 MG CAPS Take 150 mg by mouth daily. Takes 1521mdaily    . traZODone (DESYREL) 100 MG tablet Take 75 mg by mouth at bedtime. Patient only takes 7581mt bedtime    . acetaminophen (TYLENOL) 500 MG tablet Take 1,000 mg by mouth  every 6 (six) hours as needed for mild pain or headache.    . prochlorperazine (COMPAZINE) 10 MG tablet Take 10 mg by mouth every 6 (six) hours as needed for nausea/vomiting.    . promethazine (PHENERGAN) 25 MG tablet Take 1 tablet (25 mg total) by mouth every 6 (six) hours as needed for nausea or vomiting. 10 tablet 0   No current facility-administered medications for this visit.     ASSESSMENT & PLAN:   Assessment/Plan: 1. Recurrent hormone receptor positive breast cancer with metastasis to lymph nodes and bone, for which she is on palliative exemestane/everolimus after progression of disease on letrozole/palbociclib, felt to be basically stable by the radiologist on CT imaging.  Scans from early February 2021 showed mild worsening, so we referred her to Dr. PalOrlene Ermd she completed radiation to the mediastinal mass in March.  She was receiving palliative gemcitabine/vinorelbine.  The dose of vinorelbine was reduced by 25% with the 2nd cycle due to severe constipation, as well as mild neutropenia.  She had tolerated chemotherapy fairly well, but this was discontinued this due to progressive disease.  She is now receiving fulvestrant injections, as she did not desire further aggressive chemotherapy.  She will  continue fulvestrant monthly, as well denosumab for the bone metastasis. 2. 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 3. History of stage I right breast cancer February 1996 treated with surgery, radiation and 5 years of tamoxifen. 4. History of IIA right breast cancer April 2008 treated with surgery, chemotherapy, and hormonal therapy. 5. Hypocalcemia, which resolved with the addition of calcitriol to her calcium 3000 mg 4 times daily, as well as discontinuing omeprazole. She knows to continue this calcium supplement.   6. Bilateral mild upper extremity lymphedema. 7. 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  As  this developed on treatment with exemestane/everolimus, she was switched to IV chemotherapy with gemcitabine/vinorelbine.  She had increased nausea, vomiting and abdominal pain.  CT imaging was obtained in April revealed worsening rectal tumor with partial large bowel obstruction.  This was causing her much distress, as well as skin irritation.  She therefore underwent diverting colostomy in June and tolerated this procedure without difficulty.  Her colostomy is functioning well. 8. Anxiety and depression, improved with bupropion, in addition to lorazepam 1 mg twice daily. 9. Hypokalemia, resolved with potassium chloride 10 mEq twice daily.  She knows to continue this. 10. Worsening dysphagia, which Dr. Melina Copa felt was due to the neck swelling.  I will obtain a CT neck for further evaluation.  The results are pending. 11. Dyspnea with exertion with hypoxia with exertion, I obtained CT angio chest today to evaluate for pulmonary embolism or other acute abnormality.  This did not reveal any evidence of pulmonary embolism.  Interval development of small bilateral pleural effusions, left greater than right, is seen.  The ill-defined soft tissue attenuation in the anterior mediastinum into the right thoracic inlet is not substantially changed.  There is similar appearance of the sclerotic and lytic bone lesions in the thoracic spine.  She will proceed with fulvestrant and denosumab on December 22nd.  I will contact them with the results of the CT neck  We will plan to see her back in 1 week to discuss further treatment options.  As the CT scans were ordered stat, I did not obtain a CT abdomen and pelvis, but we may wish to do that to re-evaluate the rectal lesion.  The patient and her daughter-in-law understand the plans discussed today and are in agreement with them.  They know to contact our office if they develop concerns prior to her next appointment.   Marvia Pickles, PA-C    Addendum:  Exam(s):  [phone] CT/CT NECK W/ CM CLINICAL DATA:  Bilateral breast cancer with multiple recurrences. Personal history of metastatic disease to the bone and cervical adenopathy. Bilateral neck swelling.  EXAM: CT NECK WITH CONTRAST  TECHNIQUE: Multidetector CT imaging of the neck was performed using the standard protocol following the bolus administration of intravenous contrast.  CONTRAST:  100 mL Isovue 370  COMPARISON:  CT of the neck with contrast 11/07/2019 and 03/03/2018  FINDINGS: Pharynx and larynx: Nasopharynx and soft palate are within normal limits. Oropharynx is unremarkable. There is ill-defined enhancing soft tissue along the posterior left to aryepiglottic fold and piriform sinus. Vocal cords are within normal limits. Trachea is normal.  Salivary glands: The submandibular glands are atrophic bilaterally, smaller than on the prior study. Parotid glands are unremarkable.  Thyroid: Normal  Lymph nodes: Right level 2 lymph node is stable measuring 12.5 mm cephalo caudad. Posterior right level 3 your posterior triangle node  measures up to 10.5 mm. Other hyperdense subcentimeter right cervical nodes are stable. No enlarging or new nodes are present.  Vascular: Atherosclerotic calcifications are present within the cavernous internal scratched at atherosclerotic calcifications are present the carotid bifurcations bilaterally. Increasing soft tissue surrounds the carotid bifurcations bilaterally. No significant stenosis is present.  Limited intracranial: Within normal limits.  Visualized orbits: The globes and orbits are within normal limits.  Mastoids and visualized paranasal sinuses: Minimal mucosal thickening is present posterior right sphenoid sinus. Chronic opacification left anterior ethmoid air cells is present. No fluid levels are present. Mastoid air cells are clear.  Skeleton: Increased density is concerning for sclerotic lesion at C3. Diffuse increased  density is present at T2 and T3 with some progressive endplate change or collapse at T2. Mixed sclerotic and lytic lesion is present on the left at T5, progressive from the prior exams.  Upper chest: Lung apices are clear. Right subclavian Port-A-Cath is in place.  Other: Enhancing soft tissue anterior to the right thyroid cartilage has increased in size and enhancement, now measuring 17 x 28 mm. Increasing soft tissue enhancement is also present posterior to the left carotid sheath and in the left sternocleidomastoid muscle. Irregular enhancement is noted along the subclavian veins bilaterally and in the right scalene musculature.  IMPRESSION: 1. Progressive diffuse soft tissue enhancement. This is now more concerning for tumor rather than simply post radiation change. Sampling of soft tissue anterior to the right thyroid cartilage or in the left sternocleidomastoid muscle may be useful for further evaluation. 2. Stable right cervical adenopathy. 3. Progressive osseous metastatic disease at C3, T2, and T3.   Electronically Signed   By: San Morelle M.D.   On: 01/30/2020 12:42

## 2020-01-30 ENCOUNTER — Other Ambulatory Visit: Payer: Self-pay

## 2020-01-30 ENCOUNTER — Telehealth: Payer: Self-pay | Admitting: Hematology and Oncology

## 2020-01-30 ENCOUNTER — Inpatient Hospital Stay (INDEPENDENT_AMBULATORY_CARE_PROVIDER_SITE_OTHER): Payer: Medicare Other | Admitting: Hematology and Oncology

## 2020-01-30 ENCOUNTER — Inpatient Hospital Stay: Payer: Medicare Other | Attending: Oncology

## 2020-01-30 ENCOUNTER — Encounter: Payer: Self-pay | Admitting: Hematology and Oncology

## 2020-01-30 ENCOUNTER — Other Ambulatory Visit: Payer: Self-pay | Admitting: Hematology and Oncology

## 2020-01-30 VITALS — BP 120/65 | HR 107 | Temp 98.1°F | Resp 18 | Ht 65.5 in | Wt 129.6 lb

## 2020-01-30 DIAGNOSIS — R221 Localized swelling, mass and lump, neck: Secondary | ICD-10-CM

## 2020-01-30 DIAGNOSIS — R0609 Other forms of dyspnea: Secondary | ICD-10-CM

## 2020-01-30 DIAGNOSIS — Z5111 Encounter for antineoplastic chemotherapy: Secondary | ICD-10-CM | POA: Diagnosis present

## 2020-01-30 DIAGNOSIS — Z170 Estrogen receptor positive status [ER+]: Secondary | ICD-10-CM | POA: Insufficient documentation

## 2020-01-30 DIAGNOSIS — Z90710 Acquired absence of both cervix and uterus: Secondary | ICD-10-CM | POA: Diagnosis not present

## 2020-01-30 DIAGNOSIS — Z90722 Acquired absence of ovaries, bilateral: Secondary | ICD-10-CM | POA: Diagnosis not present

## 2020-01-30 DIAGNOSIS — Z923 Personal history of irradiation: Secondary | ICD-10-CM | POA: Insufficient documentation

## 2020-01-30 DIAGNOSIS — E039 Hypothyroidism, unspecified: Secondary | ICD-10-CM | POA: Insufficient documentation

## 2020-01-30 DIAGNOSIS — C50111 Malignant neoplasm of central portion of right female breast: Secondary | ICD-10-CM | POA: Insufficient documentation

## 2020-01-30 DIAGNOSIS — Z9079 Acquired absence of other genital organ(s): Secondary | ICD-10-CM | POA: Insufficient documentation

## 2020-01-30 DIAGNOSIS — C7951 Secondary malignant neoplasm of bone: Secondary | ICD-10-CM | POA: Insufficient documentation

## 2020-01-30 DIAGNOSIS — F418 Other specified anxiety disorders: Secondary | ICD-10-CM | POA: Diagnosis not present

## 2020-01-30 DIAGNOSIS — R0600 Dyspnea, unspecified: Secondary | ICD-10-CM | POA: Diagnosis not present

## 2020-01-30 DIAGNOSIS — C7952 Secondary malignant neoplasm of bone marrow: Secondary | ICD-10-CM

## 2020-01-30 DIAGNOSIS — Z79899 Other long term (current) drug therapy: Secondary | ICD-10-CM | POA: Diagnosis not present

## 2020-01-30 LAB — BASIC METABOLIC PANEL
BUN: 21 (ref 4–21)
CO2: 27 — AB (ref 13–22)
Chloride: 102 (ref 99–108)
Creatinine: 1.4 — AB (ref 0.5–1.1)
Glucose: 107
Potassium: 4 (ref 3.4–5.3)
Sodium: 137 (ref 137–147)

## 2020-01-30 LAB — CBC AND DIFFERENTIAL
HCT: 45 (ref 36–46)
Hemoglobin: 14.9 (ref 12.0–16.0)
Neutrophils Absolute: 5.15
Platelets: 274 (ref 150–399)
WBC: 6.2

## 2020-01-30 LAB — HEPATIC FUNCTION PANEL
ALT: 16 (ref 7–35)
AST: 26 (ref 13–35)
Alkaline Phosphatase: 60 (ref 25–125)
Bilirubin, Total: 0.5

## 2020-01-30 LAB — CBC: RBC: 5 (ref 3.87–5.11)

## 2020-01-30 LAB — COMPREHENSIVE METABOLIC PANEL
Albumin: 4.4 (ref 3.5–5.0)
Calcium: 10.2 (ref 8.7–10.7)

## 2020-01-30 NOTE — Progress Notes (Signed)
Ambulatory O2 sats: beginning: HR 104, O2 sats 96%. Ending after 5 labs around nurses station: HR 107, Sats 86%. Vida Roller, PA-C made aware.

## 2020-01-30 NOTE — Telephone Encounter (Signed)
12/20 los stat cta chest and ct neck sched for 12/20.Next dv appt sched and given to patient

## 2020-01-31 LAB — CANCER ANTIGEN 27.29: CA 27.29: 113.7 U/mL — ABNORMAL HIGH (ref 0.0–38.6)

## 2020-02-01 ENCOUNTER — Other Ambulatory Visit: Payer: Self-pay

## 2020-02-01 ENCOUNTER — Inpatient Hospital Stay: Payer: Medicare Other

## 2020-02-01 VITALS — BP 118/56 | HR 102 | Temp 98.7°F | Resp 18 | Ht 65.5 in | Wt 124.4 lb

## 2020-02-01 DIAGNOSIS — Z5111 Encounter for antineoplastic chemotherapy: Secondary | ICD-10-CM | POA: Diagnosis not present

## 2020-02-01 DIAGNOSIS — C7952 Secondary malignant neoplasm of bone marrow: Secondary | ICD-10-CM

## 2020-02-01 DIAGNOSIS — C7951 Secondary malignant neoplasm of bone: Secondary | ICD-10-CM

## 2020-02-01 DIAGNOSIS — C50111 Malignant neoplasm of central portion of right female breast: Secondary | ICD-10-CM

## 2020-02-01 DIAGNOSIS — Z170 Estrogen receptor positive status [ER+]: Secondary | ICD-10-CM

## 2020-02-01 MED ORDER — FULVESTRANT 250 MG/5ML IM SOLN
INTRAMUSCULAR | Status: AC
  Filled 2020-02-01: qty 10

## 2020-02-01 MED ORDER — DENOSUMAB 120 MG/1.7ML ~~LOC~~ SOLN
SUBCUTANEOUS | Status: AC
  Filled 2020-02-01: qty 1.7

## 2020-02-01 MED ORDER — FULVESTRANT 250 MG/5ML IM SOLN
500.0000 mg | Freq: Once | INTRAMUSCULAR | Status: AC
  Administered 2020-02-01: 500 mg via INTRAMUSCULAR

## 2020-02-01 MED ORDER — DENOSUMAB 120 MG/1.7ML ~~LOC~~ SOLN
120.0000 mg | Freq: Once | SUBCUTANEOUS | Status: AC
  Administered 2020-02-01: 120 mg via SUBCUTANEOUS

## 2020-02-01 NOTE — Patient Instructions (Signed)
Denosumab injection What is this medicine? DENOSUMAB (den oh sue mab) slows bone breakdown. Prolia is used to treat osteoporosis in women after menopause and in men, and in people who are taking corticosteroids for 6 months or more. Xgeva is used to treat a high calcium level due to cancer and to prevent bone fractures and other bone problems caused by multiple myeloma or cancer bone metastases. Xgeva is also used to treat giant cell tumor of the bone. This medicine may be used for other purposes; ask your health care provider or pharmacist if you have questions. COMMON BRAND NAME(S): Prolia, XGEVA What should I tell my health care provider before I take this medicine? They need to know if you have any of these conditions:  dental disease  having surgery or tooth extraction  infection  kidney disease  low levels of calcium or Vitamin D in the blood  malnutrition  on hemodialysis  skin conditions or sensitivity  thyroid or parathyroid disease  an unusual reaction to denosumab, other medicines, foods, dyes, or preservatives  pregnant or trying to get pregnant  breast-feeding How should I use this medicine? This medicine is for injection under the skin. It is given by a health care professional in a hospital or clinic setting. A special MedGuide will be given to you before each treatment. Be sure to read this information carefully each time. For Prolia, talk to your pediatrician regarding the use of this medicine in children. Special care may be needed. For Xgeva, talk to your pediatrician regarding the use of this medicine in children. While this drug may be prescribed for children as young as 13 years for selected conditions, precautions do apply. Overdosage: If you think you have taken too much of this medicine contact a poison control center or emergency room at once. NOTE: This medicine is only for you. Do not share this medicine with others. What if I miss a dose? It is  important not to miss your dose. Call your doctor or health care professional if you are unable to keep an appointment. What may interact with this medicine? Do not take this medicine with any of the following medications:  other medicines containing denosumab This medicine may also interact with the following medications:  medicines that lower your chance of fighting infection  steroid medicines like prednisone or cortisone This list may not describe all possible interactions. Give your health care provider a list of all the medicines, herbs, non-prescription drugs, or dietary supplements you use. Also tell them if you smoke, drink alcohol, or use illegal drugs. Some items may interact with your medicine. What should I watch for while using this medicine? Visit your doctor or health care professional for regular checks on your progress. Your doctor or health care professional may order blood tests and other tests to see how you are doing. Call your doctor or health care professional for advice if you get a fever, chills or sore throat, or other symptoms of a cold or flu. Do not treat yourself. This drug may decrease your body's ability to fight infection. Try to avoid being around people who are sick. You should make sure you get enough calcium and vitamin D while you are taking this medicine, unless your doctor tells you not to. Discuss the foods you eat and the vitamins you take with your health care professional. See your dentist regularly. Brush and floss your teeth as directed. Before you have any dental work done, tell your dentist you are   receiving this medicine. Do not become pregnant while taking this medicine or for 5 months after stopping it. Talk with your doctor or health care professional about your birth control options while taking this medicine. Women should inform their doctor if they wish to become pregnant or think they might be pregnant. There is a potential for serious side  effects to an unborn child. Talk to your health care professional or pharmacist for more information. What side effects may I notice from receiving this medicine? Side effects that you should report to your doctor or health care professional as soon as possible:  allergic reactions like skin rash, itching or hives, swelling of the face, lips, or tongue  bone pain  breathing problems  dizziness  jaw pain, especially after dental work  redness, blistering, peeling of the skin  signs and symptoms of infection like fever or chills; cough; sore throat; pain or trouble passing urine  signs of low calcium like fast heartbeat, muscle cramps or muscle pain; pain, tingling, numbness in the hands or feet; seizures  unusual bleeding or bruising  unusually weak or tired Side effects that usually do not require medical attention (report to your doctor or health care professional if they continue or are bothersome):  constipation  diarrhea  headache  joint pain  loss of appetite  muscle pain  runny nose  tiredness  upset stomach This list may not describe all possible side effects. Call your doctor for medical advice about side effects. You may report side effects to FDA at 1-800-FDA-1088. Where should I keep my medicine? This medicine is only given in a clinic, doctor's office, or other health care setting and will not be stored at home. NOTE: This sheet is a summary. It may not cover all possible information. If you have questions about this medicine, talk to your doctor, pharmacist, or health care provider.  2020 [employer] (2017-06-05 16:10:44) Fulvestrant injection What is this medicine? FULVESTRANT (ful VES trant) blocks the effects of estrogen. It is used to treat breast cancer. This medicine may be used for other purposes; ask your health care provider or pharmacist if you have questions. COMMON BRAND NAME(S): FASLODEX What should I tell my health care  provider before I take this medicine? They need to know if you have any of these conditions:  bleeding disorders  liver disease  low blood counts, like low white cell, platelet, or red cell counts  an unusual or allergic reaction to fulvestrant, other medicines, foods, dyes, or preservatives  pregnant or trying to get pregnant  breast-feeding How should I use this medicine? This medicine is for injection into a muscle. It is usually given by a health care professional in a hospital or clinic setting. Talk to your pediatrician regarding the use of this medicine in children. Special care may be needed. Overdosage: If you think you have taken too much of this medicine contact a poison control center or emergency room at once. NOTE: This medicine is only for you. Do not share this medicine with others. What if I miss a dose? It is important not to miss your dose. Call your doctor or health care professional if you are unable to keep an appointment. What may interact with this medicine?  medicines that treat or prevent blood clots like warfarin, enoxaparin, dalteparin, apixaban, dabigatran, and rivaroxaban This list may not describe all possible interactions. Give your health care provider a list of all the medicines, herbs, non-prescription drugs, or dietary supplements you use.   Also tell them if you smoke, drink alcohol, or use illegal drugs. Some items may interact with your medicine. What should I watch for while using this medicine? Your condition will be monitored carefully while you are receiving this medicine. You will need important blood work done while you are taking this medicine. Do not become pregnant while taking this medicine or for at least 1 year after stopping it. Women of child-bearing potential will need to have a negative pregnancy test before starting this medicine. Women should inform their doctor if they wish to become pregnant or think they might be pregnant. There is  a potential for serious side effects to an unborn child. Men should inform their doctors if they wish to father a child. This medicine may lower sperm counts. Talk to your health care professional or pharmacist for more information. Do not breast-feed an infant while taking this medicine or for 1 year after the last dose. What side effects may I notice from receiving this medicine? Side effects that you should report to your doctor or health care professional as soon as possible:  allergic reactions like skin rash, itching or hives, swelling of the face, lips, or tongue  feeling faint or lightheaded, falls  pain, tingling, numbness, or weakness in the legs  signs and symptoms of infection like fever or chills; cough; flu-like symptoms; sore throat  vaginal bleeding Side effects that usually do not require medical attention (report to your doctor or health care professional if they continue or are bothersome):  aches, pains  constipation  diarrhea  headache  hot flashes  nausea, vomiting  pain at site where injected  stomach pain This list may not describe all possible side effects. Call your doctor for medical advice about side effects. You may report side effects to FDA at 1-800-FDA-1088. Where should I keep my medicine? This drug is given in a hospital or clinic and will not be stored at home. NOTE: This sheet is a summary. It may not cover all possible information. If you have questions about this medicine, talk to your doctor, pharmacist, or health care provider.  2020 [employer] Standard (2017-05-07 11:34:41)  

## 2020-02-01 NOTE — Progress Notes (Signed)
PT STABLE AT TIME OF DISCHARGE 

## 2020-02-06 ENCOUNTER — Encounter: Payer: Self-pay | Admitting: Hematology and Oncology

## 2020-02-07 NOTE — Progress Notes (Signed)
[facility]  [address] [place],  [place]  [address] [phone]  Clinic Day:  02/08/2020  Referring physician: Ronita Hipps, MD   CHIEF COMPLAINT:  CC: Metastatic breast cancer  Current Treatment:   Treatment planning still being finalizied, reevaluation/palliative care   HISTORY OF PRESENT ILLNESS:  Stacy Park is a 64 y.o. female with a history of multiple breast cancers.  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  In February 1996, she developed a stage I hormone receptor positive right breast cancer, which was treated with lumpectomy, radiotherapy and tamoxifen for 5 years.  She had genetic testing for BRCA 1 and BRCA 2 at that time, which was apparently negative.  She has a very strong paternal family history, yet her father had completely negative genetic testing even though he had female breast cancer.  Her sister who died  due to metastatic breast cancer had a variant of uncertain significance of BRCA 2.  We began seeing her in April 2008, when she developed a second stage IIA (T2 N0 M0) hormone receptor positive right-sided breast cancer.  She was treated with a right modified radical mastectomy.  Pathology revealed a 2.8 cm, grade 2, invasive ductal carcinoma with lymphovascular invasion with negative nodes.  Estrogen and progesterone receptors were positive and her 2 Neu negative.  She received adjuvant chemotherapy consisting of 4 cycles of doxorubicin and cyclophosphamide, followed by 4 cycles of paclitaxel.  She was then placed on anastrozole 1 mg daily in December 2008.  Bone density scan was in January 2017 was normal.   She discontinued anastrozole after completing nearly 9 years of therapy, due to concerns over side effects on bones, as well as alendronate because of dental problems. She has had a TAH/BSO.   She developed a globus  sensation of her throat in January 2020 and Dr. Gaylyn Cheers only found mild edema of the arytenoid region, but no nodule was seen.  She continued to have a sensation of a lump in her throat and had an ultrasound of the thyroid which revealed 3 nodules within the left thyroid the largest measuring 1 cm, but this was densely calcified.  CT neck did show an abnormal lymph node mass in the mediastinum measuring 5 cm and encasing the left innominate vein.  There was also an enlarged right internal mammary lymph node measuring 22 mm and a cluster of hyperenhancing lymph nodes in the right lateral neck up to 8 mm in diameter with pathologic enhancement.  Biopsy of the neck node revealed metastatic breast cancer which was estrogen receptor positive and HER2 negative.  CT chest, abdomen and pelvis revealed metastases to the bone of the thoracic spine and pelvis, as well as right internal mammary and prevascular adenopathy.  MRI head was negative for intracranial metastasis.  Bone scan revealed metastasis to the T2, T3, T5 and T6 vertebral bodies, as well as the bilateral iliac bones and sacrum.  The patient was started on letrozole 2.5 mg daily, as well as palbociclib 125 mg daily for 3 weeks on and 1 week off in February of 2020.  She was also placed on denosumab injections monthly and received her 1st dose in February.  Denosumab was held in March due to low normal calcium of 8.4, despite taking calcium with vitamin-D supplement 3 times daily.  The hypocalcemia was felt to be most likely secondary to  denosumab, so we increased her calcium and vitamin-D supplement.  Parathyroid hormone was elevated.  In April 2020, she had an acute gastrointestinal illness, and worsening neutropenia, hypocalcemia and hypokalemia.  She was given IV fluids, potassium and calcium.   CT chest, abdomen and pelvis revealed a stable pre-vascular mass and unchanged bony metastasis. There was mild bowel wall thickening and inflammatory appearance of the  duodenum and jejunum with enlarged lymph nodes in the adjacent mesentery, which was felt to be consistent with nonspecific infectious or inflammatory enteritis. A stool specimen was positive for lactoferrin, but stool culture, Giardia and ova and parasites were negative.  Also, serology for H pylori was negative. She resumed palbociclib and denosumab on May 6th, after her liver function tests had returned to normal.  She was due for another cycle of palbociclib, as well as denosumab on June 3rd, but this was delayed due to neutropenia and hypocalcemia.  We had her increase her oral calcium again to 3000 mg 3 times per day.   Repeat CT imaging in August 2020 revealed a slight interval increase in size of the superior mediastinal soft tissue mass and increased mass effect on the central left brachiocephalic vein resulting in collateral vasculature within the mediastinum with appearing osseous sclerotic and lytic metastasis.  The CA27.29 had increased from 44.7 to 52.3 over the previous 6 months. Due to progressive disease, her therapy was changed to exemestane/everolimus, started on August 6th.  She had persistent hypocalcemia with a calcium of 8.2 at that time, so denosumab was held and calcitriol was added. CT brain was negative, and the anterior mediastinal mass measured 5.1 x 3.7 cm, previously 5.8 x 3.8 cm, which was stable.  She had worsening anemia and was found to have iron deficiency, for which she is on oral iron supplement daily. Her cancer marker had increased from 80 to 91, so we obtained CT chest, abdomen, and pelvis in February of 2021, which revealed a fairly stable infiltrative high right mediastinal mass measuring 6.4 x 4.1 cm, previously 6.1 x 4.0 cm.  The rest remained stable, but there was a new nonspecific irregular annular wall thickening involving the mid rectum. MRI chest in February revealed no substantial change in the known 5.9 x 3.6 cm anterior mediastinal soft tissue mass.  The lesion  encases and attenuates the innominate veins, while also encasing the brachiocephalic artery and left common carotid artery. She saw Dr. Orlene Erm for palliative radiation to the mediastinal mass, completed in March. Colonoscopy with Dr. Melina Copa revealed pathology with rare atypical cells consistent with carcinoma.  Immunohistochemistry was positive for cytokeratin AE1/AE3.  The mucosa is intact.  This does not appear to be a  primary malignancy of the rectum, so we feel it is consistent with metastatic breast cancer.   She was placed palliative IV chemotherapy with gemcitabine/vinorelbine every 2 weeks in April.  Unfortunately, she had a fall and sustained right ankle fracture.  She underwent surgical repair with plate and pins and was discharged home.  Then she developed severe constipation, felt to be secondary to narcotic medication and immobilization.  CT abdomen and pelvis in April revealed significant worsening of annular wall thickening and hyperenhancement throughout the rectum with worsening perirectal and presacral space fat stranding and ill-defined fluid.  There was new moderate right hydroureteronephrosis to the level of the lower right lumbar ureter, with surrounding fat stranding and ill-defined soft tissue in this location, suspicious for extrinsic ureteral obstruction. There were stable axial skeleton bone metastases.  The  infiltrative high right mediastinal mass showed some improvement. There appeared to be partial large bowel obstruction, and Dr. Coralie Keens discussed the options. She had a 2nd cycle of gemcitabine/vinorelbine in May, with dose of vinorelbine decreased by 25% due to the severe constipation. She then reported loose bowels about 10 times per day, so a diverting colostomy was performed in June.  Chemotherapy was placed on hold following the procedure and was resumed in June.  The CA 27-29 remained stable at 86.1 in June and 78.3 in July.  Left screening mammogram in July did not reveal  any evidence of malignancy. CT chest, abdomen, and pelvis in August revealing new clustered dense pleural nodules in the anterior medial left pleural space, largest 1.3 x 0.6 cm, suspicious for new pleural metastases.  CT neck revealed infiltrative tissue in the right thoracic inlet felt to be due to radiation changes.  The CA 27-29 increased to 97.6.  She opted to discontinue aggressive chemotherapy and was placed on endocrine therapy with fulvestrant, with denosumab every 4 weeks.  She had worsening anxiety requiring higher doses of lorazepam, up to 1-3 mg twice daily. She developed severe debilitating depression, so was placed on bupropion by Dr. Helene Kelp. CT head was negative.  The CA 27-29 remained fairly stable at 103.1 in September, 97.5 later in September and 107.2 in October.  She has had increased difficulty swallowing, so she has to crush all her medications now and is eating soft foods to avoid choking, and so has been loosing weight.  INTERVAL HISTORY:  Hope is here for follow up to review recent imaging results.  CT neck from December 20th revealed progressive diffuse soft tissue enhancement.  This is now more concerning for tumor rather than simply post radiation change.  There is also progressive osseous metastatic disease at C3, T2 and T3.  The right cervical adenopathy remains stable.  CTA chest revealed interval development of small bilateral pleural effusions, left greater than right.  There is ill-defined soft tissue attenuation of the anterior mediastinum extending into the right thoracic inlet which is not substantially changed in the interval.  The sclerotic and lytic bone lesions in the thoracic spine including left T5 lesion with suggestion of epidural tumor involvement remains stable.  She continues to have dysphagia which is mildly worse.  She does note some anxiety today in regards to her scan results.  Her depression is well controlled with her current medication.  Her  appetite is  good, and she has lost 2 pounds since her last visit.  She denies fever, chills or other signs of infection.  She denies nausea, vomiting, bowel issues, or abdominal pain.  She denies sore throat, cough, dyspnea, or chest pain.   Her son and daughter-in-law accompanied her today.    REVIEW OF SYSTEMS:  Review of Systems  Constitutional: Negative.   HENT:   Positive for trouble swallowing (worsening).        Neck swelling  Eyes: Negative.   Respiratory: Negative.   Cardiovascular: Negative.   Gastrointestinal: Negative.   Endocrine: Negative.   Genitourinary: Negative.    Musculoskeletal: Negative.   Skin: Negative.   Neurological: Negative.   Hematological: Negative.   Psychiatric/Behavioral: Positive for depression (well controlled with medication). The patient is nervous/anxious (due to her scan results).      VITALS:  Blood pressure 127/74, pulse (!) 105, temperature 98.7 F (37.1 C), resp. rate 14, height 5' 5.5" (1.664 m), weight 127 lb 12.8 oz (58 kg), SpO2 94 %.  Wt Readings from Last 3 Encounters:  02/08/20 127 lb 12.8 oz (58 kg)  02/01/20 124 lb 6 oz (56.4 kg)  01/30/20 129 lb 9.6 oz (58.8 kg)    Body mass index is 20.94 kg/m.  Performance status (ECOG): 1 - Symptomatic but completely ambulatory  PHYSICAL EXAM:  Physical Exam Constitutional:      General: She is not in acute distress.    Appearance: Normal appearance. She is normal weight.  HENT:     Head: Normocephalic and atraumatic.  Eyes:     General: No scleral icterus.    Extraocular Movements: Extraocular movements intact.     Conjunctiva/sclera: Conjunctivae normal.     Pupils: Pupils are equal, round, and reactive to light.  Neck:     Comments: Fairly severe swelling of bilateral neck with induration which is erythematous and obvious for recurrent tumor. Cardiovascular:     Rate and Rhythm: Normal rate and regular rhythm.     Pulses: Normal pulses.     Heart sounds: Normal heart sounds. No murmur  heard. No friction rub. No gallop.   Pulmonary:     Effort: Pulmonary effort is normal. No respiratory distress.     Breath sounds: Wheezing (slight of the right upper lobe) present.  Chest:  Breasts:     Right: Absent.     Left: Normal.      Comments: The left breast is without masses and right mastectomy is negative.  She has a port in the right upper chest. Abdominal:     General: Bowel sounds are normal. There is no distension.     Palpations: Abdomen is soft. There is no mass.     Tenderness: There is no abdominal tenderness.  Musculoskeletal:        General: Normal range of motion.     Cervical back: Normal range of motion and neck supple.     Right lower leg: No edema.     Left lower leg: No edema.  Lymphadenopathy:     Cervical: No cervical adenopathy.  Skin:    General: Skin is warm and dry.  Neurological:     General: No focal deficit present.     Mental Status: She is alert and oriented to person, place, and time. Mental status is at baseline.  Psychiatric:        Mood and Affect: Mood normal.        Behavior: Behavior normal.        Thought Content: Thought content normal.        Judgment: Judgment normal.     Lymph nodes:    There is no cervical, clavicular, axillary or inguinal lymphadenopathy.  LABS:    CBC Latest Ref Rng & Units 01/30/2020 01/02/2020 01/02/2020  WBC - 6.2 5.0 5.0  Hemoglobin 12.0 - 16.0 14.9 13.8 13.8  Hematocrit 36 - 46 45 42 42  Platelets 150 - 399 274 241 241   CMP Latest Ref Rng & Units 01/30/2020 01/02/2020 01/02/2020  Glucose 70 - 99 mg/dL - - -  BUN 4 - 21 21 26(A) 26(A)  Creatinine 0.5 - 1.1 1.4(A) 1.3(A) 1.3(A)  Sodium 137 - 147 137 137 137  Potassium 3.4 - 5.3 4.0 3.6 3.6  Chloride 99 - 108 102 102 102  CO2 13 - 22 27(A) 27(A) 27(A)  Calcium 8.7 - 10.7 10.2 9.9 9.9  Total Protein 6.5 - 8.1 g/dL - - -  Total Bilirubin 0.3 - 1.2 mg/dL - - -  Alkaline Phos  25 - 125 60 55 55  AST 13 - 35 _0 ALT 7 - 35 _1 CA 27-29 has increased slightly from 107.2 to 113.7.  No results found for: CEA1 / No results found for: CEA1   STUDIES:    She underwent CTA chest with contrast on 01/30/2020 1. No CT evidence for acute pulmonary embolus. 2. Interval development of small bilateral pleural effusions, left greater than right. 3. The ill-defined airspace disease seen on the previous study in the posterior left lower lobe is not well demonstrated today, probably related to resolution of potentially related to volume loss and pleural fluid. 4. Ill-defined soft tissue attenuation anterior mediastinum extending into the right thoracic inlet is not substantially changed in the interval. 5. Similar appearance of sclerotic and lytic bone lesions in the thoracic spine including left T5 lesion with suggestion of epidural tumor involvement. 6. Aortic Atherosclerosis (ICD10-I70.0).  She underwent a CT neck with contrast on 01/30/2020 showing: 1.  Progressive diffuse soft tissue enhancement.  This is now more concerning for tumor rather than simply post radiation change.  Sampling of soft tissue anterior to the right thyroid cartilage or in the left sternocleidomastoid muscle may be useful for further evaluation. 2.  Stable right cervical adenopathy. 3.  Progressive osseous metastatic disease at C3, T2, and T3.   HISTORY:   Allergies:  Allergies  Allergen Reactions  . Latex Rash    Per pt "tears skin"  . Penicillins Rash    Current Medications: Current Outpatient Medications  Medication Sig Dispense Refill  . acetaminophen (TYLENOL) 500 MG tablet Take 1,000 mg by mouth every 6 (six) hours as needed for mild pain or headache.    Marland Kitchen buPROPion (WELLBUTRIN XL) 150 MG 24 hr tablet     . calcitRIOL (ROCALTROL) 0.25 MCG capsule Take 0.25 mcg by mouth daily.    Marland Kitchen CALCIUM CARBONATE-VIT D-MIN PO Take 9,600 mg by mouth daily. 962m daily    . Denosumab (XGEVA [place]) Inject into the skin.    .Marland Kitchenlevothyroxine  (SYNTHROID, LEVOTHROID) 50 MCG tablet Take 50 mcg by mouth daily.    .Marland KitchenLORazepam (ATIVAN) 1 MG tablet Take 1 mg by mouth 2 (two) times daily.    . ondansetron (ZOFRAN) 4 MG tablet Take 4 mg by mouth every 4 (four) hours as needed for nausea/vomiting.    . potassium chloride (KLOR-CON) 10 MEQ tablet TAKE 1 TABLET TWICE DAILY 180 tablet 0  . Probiotic Product (PROBIOTIC-10 PO) Take 1 capsule by mouth daily.    . prochlorperazine (COMPAZINE) 10 MG tablet Take 10 mg by mouth every 6 (six) hours as needed for nausea/vomiting.    . promethazine (PHENERGAN) 25 MG tablet Take 1 tablet (25 mg total) by mouth every 6 (six) hours as needed for nausea or vomiting. 10 tablet 0  . Sertraline HCl 150 MG CAPS Take 150 mg by mouth daily. Takes 1576mdaily    . traZODone (DESYREL) 100 MG tablet Take 75 mg by mouth at bedtime. Patient only takes 7554mt bedtime     No current facility-administered medications for this visit.     ASSESSMENT & PLAN:   Assessment: 1. Recurrent hormone receptor positive breast cancer with metastasis to lymph nodes and bone, for which she is on palliative exemestane/everolimus after progression of disease on letrozole/palbociclib, felt to be basically stable by the radiologist on CT imaging.  Scans from early February 2021 showed mild worsening, so we referred her to  Dr. Orlene Erm and she completed radiation to the mediastinal mass in March.  She was receiving palliative gemcitabine/vinorelbine.  The dose of vinorelbine was reduced by 25% with the 2nd cycle due to severe constipation, as well as mild neutropenia.  She had tolerated chemotherapy fairly well, but this was discontinued this due to progressive disease.  She now clearly has progressive disease and so we will stop the fulvestrant injections.  2. 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  3. History of stage I right breast cancer February 1996 treated with surgery,  radiation and 5 years of tamoxifen.  4. History of IIA right breast cancer April 2008 treated with surgery, chemotherapy, and hormonal therapy.  5. Hypocalcemia, which resolved with the addition of calcitriol to her calcium 3000 mg 4 times daily, as well as discontinuing omeprazole. She knows to continue this calcium supplement.    6. Bilateral mild upper extremity lymphedema.  7. 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  As this developed on treatment with exemestane/everolimus, she was switched to IV chemotherapy with gemcitabine/vinorelbine.  She had increased nausea, vomiting and abdominal pain.  CT imaging was obtained in April revealed worsening rectal tumor with partial large bowel obstruction.  This was causing her much distress, as well as skin irritation.  She therefore underwent diverting colostomy in June and tolerated this procedure without difficulty.  Her colostomy is functioning well.  8. Anxiety and depression, improved with bupropion, in addition to lorazepam 1 mg twice daily.  9. Hypokalemia, resolved with potassium chloride 10 mEq twice daily.  She knows to continue this.  10. Worsening dysphagia, which Dr. Melina Copa felt was due to the neck swelling.  CT neck has confirmed progressive tumor but the chest is stable and no other new areas are seen.  We have concerns about eventual compromise of nutrition and airway.  We discussed a feeding tube but I do not particularly recommend pursuing that.    Plan: Recent CT imaging results has shown progressive diffuse soft tissue enhancement, which is now more concerning for tumor rather than simply post radiation change.  There is also progressive osseous metastatic disease at C3, T2 and T3.  Therefore, we will discontinue her current fulvestrant therapy.  We will also stop the denosumab as well.  I have spoken to Dr. Orlene Erm to determine if she would qualify for further radiation, but likely this will not be an option.  As  she has had limited response to multiple hormonal therapies, alternative treatment options would include IV chemotherapy.  However, this is not without side effects.  Guardant testing and PIK3CA testing could be pursued to determine if she qualifies for any targeted therapies, and she is in agreement so we will draw this today.  We also discussed comfort and supportive care measures with hospice as she also needs and wishes to focus on quality of life.  She does not wish to be resuscitated in the case of cardiopulmonary arrest, and so I filled out a DNR for her today.  Once we receive the results of the testing, we will contact her.  The patient and her family understand the plans discussed today and are in agreement with them.  They know to contact our office if they develop concerns prior to her next appointment.   I provided 40 minutes of face-to-face time during this this encounter and > 50% was spent counseling as documented under my assessment and  plan.    Derwood Kaplan, MD [facility]  I, Rita Ohara, am acting as scribe for Derwood Kaplan, MD  I have reviewed this report as typed by the medical scribe, and it is complete and accurate.

## 2020-02-08 ENCOUNTER — Encounter: Payer: Self-pay | Admitting: Oncology

## 2020-02-08 ENCOUNTER — Encounter: Payer: Self-pay | Admitting: Hematology and Oncology

## 2020-02-08 ENCOUNTER — Inpatient Hospital Stay (INDEPENDENT_AMBULATORY_CARE_PROVIDER_SITE_OTHER): Payer: Medicare Other | Admitting: Oncology

## 2020-02-08 ENCOUNTER — Other Ambulatory Visit: Payer: Self-pay

## 2020-02-08 ENCOUNTER — Inpatient Hospital Stay: Payer: Medicare Other

## 2020-02-08 VITALS — BP 127/74 | HR 105 | Temp 98.7°F | Resp 14 | Ht 65.5 in | Wt 127.8 lb

## 2020-02-08 DIAGNOSIS — Z170 Estrogen receptor positive status [ER+]: Secondary | ICD-10-CM

## 2020-02-08 DIAGNOSIS — C7989 Secondary malignant neoplasm of other specified sites: Secondary | ICD-10-CM | POA: Insufficient documentation

## 2020-02-08 DIAGNOSIS — C50111 Malignant neoplasm of central portion of right female breast: Secondary | ICD-10-CM | POA: Diagnosis not present

## 2020-02-17 ENCOUNTER — Telehealth: Payer: Self-pay | Admitting: Oncology

## 2020-02-17 ENCOUNTER — Other Ambulatory Visit: Payer: Self-pay

## 2020-02-17 NOTE — Telephone Encounter (Signed)
Per Dr Orlene Erm, patient scheduled on 1/17 at 3:30 pm to discuss test results.  Please let me know if you want to see patient on an early date

## 2020-02-20 ENCOUNTER — Telehealth: Payer: Self-pay

## 2020-02-20 ENCOUNTER — Other Ambulatory Visit: Payer: Self-pay

## 2020-02-20 NOTE — Telephone Encounter (Signed)
-----  Message from Derwood Kaplan, MD sent at 02/20/2020  3:30 PM EST ----- Regarding: FW: Medications;  Hospice request Called & spoke with pt and son Fara Olden.  She has worsening dysphagia, pain that comes & goes.  We will stop calcium, potassium, calcitriol and Synthroid.  I disc poss liquid pain med and we will prob go with tiny doses of Roxanol so that we can escalate if she has choking.  Unfortunately the tumor around her neck will likely close off her esophagus and eventually her breathing so will need to medicate at that time.   She has breast cancer met. To nodes, rectal area requiring colostomy, & now neck. We will continue antidep as poss and Ativan. Has appt with me next wk, up to her whether to come ----- Message ----- From: Dairl Ponder, RN Sent: 02/20/2020   2:42 PM EST To: Derwood Kaplan, MD, Marvia Pickles, PA-C Subject: Medications;  Hospice request                  Pt called to confirm that she was told to stop all medications, except her antidepressants, & anti - anxiety meds? The medications she called out to me were the Zoloft, Wellbutrin, trazodone, & lorazepam. Pt states, "I understand I don't have much time left". She states her son & daughter in law heard something different than she did, from you.    @ 1158, Pt's son, Fara Olden, called requesting to speak w/ Dr Hinton Rao, as he feels it is time to call Hospice.  $RemoveB'@1405'KUldCXvo$  Pt's son, Fara Olden, called back req to speak to [facility] or Dr Hinton Rao, as he realized Dr Hinton Rao is busy.  He states his mom just told them she was having sharp pains in her neck, & they are getting more frequent. They are wanting Hospice services.  $RemoveBe'@1435'oculYfCiG$ , I spoke with Dr Hinton Rao regarding above request for referral to Hospice from pt's son, Fara Olden. Dr Hinton Rao is in agreement that pt is Hospice appropriate.  $RemoveBefor'@1440'vdTWdstzlHXm$ , I attempted call to Fara Olden to notify him of above, no answer. I did LVM on identified answering machine that we would start referral and to call us  back with questions/concerns.  [phone]

## 2020-02-20 NOTE — Telephone Encounter (Signed)
Faxed all information to [facility].

## 2020-02-21 ENCOUNTER — Other Ambulatory Visit: Payer: Self-pay | Admitting: Hematology and Oncology

## 2020-02-21 MED ORDER — MORPHINE SULFATE (CONCENTRATE) 20 MG/ML PO SOLN: 5.0000 mg | ORAL | 0 refills | Status: DC | PRN

## 2020-02-21 MED ORDER — ONDANSETRON 4 MG PO TBDP: 4.0000 mg | ORAL_TABLET | ORAL | 3 refills | Status: AC | PRN

## 2020-02-21 MED ORDER — LORAZEPAM 1 MG PO TABS: 0.5000 mg | ORAL_TABLET | ORAL | 0 refills | Status: DC | PRN

## 2020-02-24 NOTE — Progress Notes (Incomplete)
[facility]  [address] [place],  [place]  [id number] [phone]  Clinic Day:  02/24/2020  Referring physician: Ronita Hipps, MD   This document serves as a record of services personally performed by Hosie Poisson, MD. It was created on their behalf by Curry,Lauren E, a trained medical scribe. The creation of this record is based on the scribe's personal observations and the provider's statements to them.  CHIEF COMPLAINT:  CC: Metastatic breast cancer  Current Treatment:   Treatment planning still being finalizied, reevaluation/palliative care   HISTORY OF PRESENT ILLNESS:  Stacy Park is a 65 y.o. female with a history of multiple breast cancers.  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  In February 1996, she developed a stage I hormone receptor positive right breast cancer, which was treated with lumpectomy, radiotherapy and tamoxifen for 5 years.  She had genetic testing for BRCA 1 and BRCA 2 at that time, which was apparently negative.  She has a very strong paternal family history, yet her father had completely negative genetic testing even though he had female breast cancer.  Her sister who died  due to metastatic breast cancer had a variant of uncertain significance of BRCA 2.  We began seeing her in April 2008, when she developed a second stage IIA (T2 N0 M0) hormone receptor positive right-sided breast cancer.  She was treated with a right modified radical mastectomy.  Pathology revealed a 2.8 cm, grade 2, invasive ductal carcinoma with lymphovascular invasion with negative nodes.  Estrogen and progesterone receptors were positive and her 2 Neu negative.  She received adjuvant chemotherapy consisting of 4 cycles of doxorubicin and cyclophosphamide, followed by 4 cycles of paclitaxel.  She was then placed on anastrozole 1 mg daily in  December 2008.  Bone density scan was in January 2017 was normal.   She discontinued anastrozole after completing nearly 9 years of therapy, due to concerns over side effects on bones, as well as alendronate because of dental problems. She has had a TAH/BSO.   She developed a globus sensation of her throat in January 2020 and Dr. Gaylyn Cheers only found mild edema of the arytenoid region, but no nodule was seen.  She continued to have a sensation of a lump in her throat and had an ultrasound of the thyroid which revealed 3 nodules within the left thyroid the largest measuring 1 cm, but this was densely calcified.  CT neck did show an abnormal lymph node mass in the mediastinum measuring 5 cm and encasing the left innominate vein.  There was also an enlarged right internal mammary lymph node measuring 22 mm and a cluster of hyperenhancing lymph nodes in the right lateral neck up to 8 mm in diameter with pathologic enhancement.  Biopsy of the neck node revealed metastatic breast cancer which was estrogen receptor positive and HER2 negative.  CT chest, abdomen and pelvis revealed metastases to the bone of the thoracic spine and pelvis, as well as right internal mammary and prevascular adenopathy.  MRI head was negative for intracranial metastasis.  Bone scan revealed metastasis to the T2, T3, T5 and T6 vertebral bodies, as well as the bilateral iliac bones and sacrum.  The patient was started on letrozole 2.5 mg daily, as well as palbociclib 125 mg daily for 3 weeks on and 1 week off in February of 2020.  She was  also placed on denosumab injections monthly and received her 1st dose in February.  Denosumab was held in March due to low normal calcium of 8.4, despite taking calcium with vitamin-D supplement 3 times daily.  The hypocalcemia was felt to be most likely secondary to denosumab, so we increased her calcium and vitamin-D supplement.  Parathyroid hormone was elevated.  In April 2020, she had an acute gastrointestinal  illness, and worsening neutropenia, hypocalcemia and hypokalemia.  She was given IV fluids, potassium and calcium.   CT chest, abdomen and pelvis revealed a stable pre-vascular mass and unchanged bony metastasis. There was mild bowel wall thickening and inflammatory appearance of the duodenum and jejunum with enlarged lymph nodes in the adjacent mesentery, which was felt to be consistent with nonspecific infectious or inflammatory enteritis. A stool specimen was positive for lactoferrin, but stool culture, Giardia and ova and parasites were negative.  Also, serology for H pylori was negative. She resumed palbociclib and denosumab on May 6th, after her liver function tests had returned to normal.  She was due for another cycle of palbociclib, as well as denosumab on June 3rd, but this was delayed due to neutropenia and hypocalcemia.  We had her increase her oral calcium again to 3000 mg 3 times per day.   Repeat CT imaging in August 2020 revealed a slight interval increase in size of the superior mediastinal soft tissue mass and increased mass effect on the central left brachiocephalic vein resulting in collateral vasculature within the mediastinum with appearing osseous sclerotic and lytic metastasis.  The CA27.29 had increased from 44.7 to 52.3 over the previous 6 months. Due to progressive disease, her therapy was changed to exemestane/everolimus, started on August 6th.  She had persistent hypocalcemia with a calcium of 8.2 at that time, so denosumab was held and calcitriol was added. CT brain was negative, and the anterior mediastinal mass measured 5.1 x 3.7 cm, previously 5.8 x 3.8 cm, which was stable.  She had worsening anemia and was found to have iron deficiency, for which she is on oral iron supplement daily. Her cancer marker had increased from 80 to 91, so we obtained CT chest, abdomen, and pelvis in February of 2021, which revealed a fairly stable infiltrative high right mediastinal mass measuring 6.4  x 4.1 cm, previously 6.1 x 4.0 cm.  The rest remained stable, but there was a new nonspecific irregular annular wall thickening involving the mid rectum. MRI chest in February revealed no substantial change in the known 5.9 x 3.6 cm anterior mediastinal soft tissue mass.  The lesion encases and attenuates the innominate veins, while also encasing the brachiocephalic artery and left common carotid artery. She saw Dr. Orlene Erm for palliative radiation to the mediastinal mass, completed in March. Colonoscopy with Dr. Melina Copa revealed pathology with rare atypical cells consistent with carcinoma.  Immunohistochemistry was positive for cytokeratin AE1/AE3.  The mucosa is intact.  This does not appear to be a  primary malignancy of the rectum, so we feel it is consistent with metastatic breast cancer.   She was placed palliative IV chemotherapy with gemcitabine/vinorelbine every 2 weeks in April.  Unfortunately, she had a fall and sustained right ankle fracture.  She underwent surgical repair with plate and pins and was discharged home.  Then she developed severe constipation, felt to be secondary to narcotic medication and immobilization.  CT abdomen and pelvis in April revealed significant worsening of annular wall thickening and hyperenhancement throughout the rectum with worsening perirectal and presacral space  fat stranding and ill-defined fluid.  There was new moderate right hydroureteronephrosis to the level of the lower right lumbar ureter, with surrounding fat stranding and ill-defined soft tissue in this location, suspicious for extrinsic ureteral obstruction. There were stable axial skeleton bone metastases.  The infiltrative high right mediastinal mass showed some improvement. There appeared to be partial large bowel obstruction, and Dr. Coralie Keens discussed the options. She had a 2nd cycle of gemcitabine/vinorelbine in May, with dose of vinorelbine decreased by 25% due to the severe constipation. She then  reported loose bowels about 10 times per day, so a diverting colostomy was performed in June.  Chemotherapy was placed on hold following the procedure and was resumed in June.  The CA 27-29 remained stable at 86.1 in June and 78.3 in July.  Left screening mammogram in July did not reveal any evidence of malignancy. CT chest, abdomen, and pelvis in August revealing new clustered dense pleural nodules in the anterior medial left pleural space, largest 1.3 x 0.6 cm, suspicious for new pleural metastases.  CT neck revealed infiltrative tissue in the right thoracic inlet felt to be due to radiation changes.  The CA 27-29 increased to 97.6.  She opted to discontinue aggressive chemotherapy and was placed on endocrine therapy with fulvestrant, with denosumab every 4 weeks.  She had worsening anxiety requiring higher doses of lorazepam, up to 1-3 mg twice daily. She developed severe debilitating depression, so was placed on bupropion by Dr. Helene Kelp. CT head was negative.  The CA 27-29 remained fairly stable at 103.1 in September, 97.5 later in September and 107.2 in October.  She has had increased difficulty swallowing, so she has to crush all her medications now and is eating soft foods to avoid choking, and so has been loosing weight.  INTERVAL HISTORY:  Beckie is here for follow up to review recent imaging results.  CT neck from December 20th revealed progressive diffuse soft tissue enhancement.  This is now more concerning for tumor rather than simply post radiation change.  There is also progressive osseous metastatic disease at C3, T2 and T3.  The right cervical adenopathy remains stable.  CTA chest revealed interval development of small bilateral pleural effusions, left greater than right.  There is ill-defined soft tissue attenuation of the anterior mediastinum extending into the right thoracic inlet which is not substantially changed in the interval.  The sclerotic and lytic bone lesions in the thoracic spine  including left T5 lesion with suggestion of epidural tumor involvement remains stable.  She continues to have dysphagia which is mildly worse.  She does note some anxiety today in regards to her scan results.  Her depression is well controlled with her current medication.  Her  appetite is good, and she has lost 2 pounds since her last visit.  She denies fever, chills or other signs of infection.  She denies nausea, vomiting, bowel issues, or abdominal pain.  She denies sore throat, cough, dyspnea, or chest pain.   Her son and daughter-in-law accompanied her today.    Devyne is here for routine follow up ***.   Her  appetite is good, and she has gained/lost _ pounds since her last visit.  She denies fever, chills or other signs of infection.  She denies nausea, vomiting, bowel issues, or abdominal pain.  She denies sore throat, cough, dyspnea, or chest pain.  REVIEW OF SYSTEMS:  Review of Systems - Oncology   VITALS:  There were no vitals taken for this visit.  Wt Readings from Last 3 Encounters:  02/08/20 127 lb 12.8 oz (58 kg)  02/01/20 124 lb 6 oz (56.4 kg)  01/30/20 129 lb 9.6 oz (58.8 kg)    There is no height or weight on file to calculate BMI.  Performance status (ECOG): 1 - Symptomatic but completely ambulatory  PHYSICAL EXAM:  Physical Exam  Lymph nodes:    There is no cervical, clavicular, axillary or inguinal lymphadenopathy.  LABS:    CBC Latest Ref Rng & Units 01/30/2020 01/02/2020 01/02/2020  WBC - 6.2 5.0 5.0  Hemoglobin 12.0 - 16.0 14.9 13.8 13.8  Hematocrit 36 - 46 45 42 42  Platelets 150 - 399 274 241 241   CMP Latest Ref Rng & Units 01/30/2020 01/02/2020 01/02/2020  Glucose 70 - 99 mg/dL - - -  BUN 4 - 21 21 26(A) 26(A)  Creatinine 0.5 - 1.1 1.4(A) 1.3(A) 1.3(A)  Sodium 137 - 147 137 137 137  Potassium 3.4 - 5.3 4.0 3.6 3.6  Chloride 99 - 108 102 102 102  CO2 13 - 22 27(A) 27(A) 27(A)  Calcium 8.7 - 10.7 10.2 9.9 9.9  Total Protein 6.5 - 8.1 g/dL - - -   Total Bilirubin 0.3 - 1.2 mg/dL - - -  Alkaline Phos 25 - 125 60 55 55  AST 13 - 35 _0 ALT 7 - 35 _1 CA 27-29 has increased slightly from 107.2 to 113.7.  No results found for: CEA1 / No results found for: CEA1   STUDIES:   No current studies  HISTORY:   Allergies:  Allergies  Allergen Reactions  . Latex Rash    Per pt "tears skin"  . Penicillins Rash    Current Medications: Current Outpatient Medications  Medication Sig Dispense Refill  . LORazepam (ATIVAN) 1 MG tablet Take 0.5-1 tablets (0.5-1 mg total) by mouth every 4 (four) hours as needed for anxiety. 60 tablet 0  . morphine (ROXANOL) 20 MG/ML concentrated solution Take 0.25-0.5 mLs (5-10 mg total) by mouth every 2 (two) hours as needed for severe pain. 30 mL 0  . ondansetron (ZOFRAN ODT) 4 MG disintegrating tablet Take 1 tablet (4 mg total) by mouth every 4 (four) hours as needed for nausea or vomiting. 60 tablet 3  . ondansetron (ZOFRAN) 4 MG tablet Take 4 mg by mouth every 4 (four) hours as needed for nausea/vomiting.    . prochlorperazine (COMPAZINE) 10 MG tablet Take 10 mg by mouth every 6 (six) hours as needed for nausea/vomiting.    . promethazine (PHENERGAN) 25 MG tablet Take 1 tablet (25 mg total) by mouth every 6 (six) hours as needed for nausea or vomiting. 10 tablet 0  . sertraline (ZOLOFT) 100 MG tablet      No current facility-administered medications for this visit.     ASSESSMENT & PLAN:   Assessment: 1. Recurrent hormone receptor positive breast cancer with metastasis to lymph nodes and bone, for which she is on palliative exemestane/everolimus after progression of disease on letrozole/palbociclib, felt to be basically stable by the radiologist on CT imaging.  Scans from early February 2021 showed mild worsening, so we referred her to Dr. Orlene Erm and she completed radiation to the mediastinal mass in March.  She was receiving palliative gemcitabine/vinorelbine.  The dose of vinorelbine  was reduced by 25% with the 2nd cycle due to severe constipation, as well as mild neutropenia.  She had tolerated chemotherapy fairly well, but this was discontinued this  due to progressive disease.  She now clearly has progressive disease and so we will stop the fulvestrant injections.  2. 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  3. History of stage I right breast cancer February 1996 treated with surgery, radiation and 5 years of tamoxifen.  4. History of IIA right breast cancer April 2008 treated with surgery, chemotherapy, and hormonal therapy.  5. Hypocalcemia, which resolved with the addition of calcitriol to her calcium 3000 mg 4 times daily, as well as discontinuing omeprazole. She knows to continue this calcium supplement.    6. Bilateral mild upper extremity lymphedema.  7. 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  As this developed on treatment with exemestane/everolimus, she was switched to IV chemotherapy with gemcitabine/vinorelbine.  She had increased nausea, vomiting and abdominal pain.  CT imaging was obtained in April revealed worsening rectal tumor with partial large bowel obstruction.  This was causing her much distress, as well as skin irritation.  She therefore underwent diverting colostomy in June and tolerated this procedure without difficulty.  Her colostomy is functioning well.  8. Anxiety and depression, improved with bupropion, in addition to lorazepam 1 mg twice daily.  9. Hypokalemia, resolved with potassium chloride 10 mEq twice daily.  She knows to continue this.  10. Worsening dysphagia, which Dr. Melina Copa felt was due to the neck swelling.  CT neck has confirmed progressive tumor but the chest is stable and no other new areas are seen.  We have concerns about eventual compromise of nutrition and airway.  We discussed a feeding tube but I do not particularly recommend pursuing that.     Plan: Recent CT imaging results has shown progressive diffuse soft tissue enhancement, which is now more concerning for tumor rather than simply post radiation change.  There is also progressive osseous metastatic disease at C3, T2 and T3.  Therefore, we will discontinue her current fulvestrant therapy.  We will also stop the denosumab as well.  I have spoken to Dr. Orlene Erm to determine if she would qualify for further radiation, but likely this will not be an option.  As she has had limited response to multiple hormonal therapies, alternative treatment options would include IV chemotherapy.  However, this is not without side effects.  Guardant testing and PIK3CA testing could be pursued to determine if she qualifies for any targeted therapies, and she is in agreement so we will draw this today.  We also discussed comfort and supportive care measures with hospice as she also needs and wishes to focus on quality of life.  She does not wish to be resuscitated in the case of cardiopulmonary arrest, and has a DNR in place.  The patient and her family understand the plans discussed today and are in agreement with them.  They know to contact our office if they develop concerns prior to her next appointment.   I provided 40 minutes of face-to-face time during this this encounter and > 50% was spent counseling as documented under my assessment and plan.    Derwood Kaplan, MD [facility] [place]  I, Rita Ohara, am acting as scribe for Derwood Kaplan, MD  I have reviewed this report as typed by the medical scribe, and it is complete and accurate.

## 2020-02-27 ENCOUNTER — Inpatient Hospital Stay: Payer: Medicare Other | Attending: Oncology | Admitting: Oncology

## 2020-03-13 ENCOUNTER — Other Ambulatory Visit: Payer: Self-pay | Admitting: Hematology and Oncology

## 2020-03-13 MED ORDER — MORPHINE SULFATE (CONCENTRATE) 20 MG/ML PO SOLN: 20.0000 mg | ORAL | 0 refills | Status: DC | PRN

## 2020-03-13 MED ORDER — LORAZEPAM 1 MG PO TABS: 1.0000 mg | ORAL_TABLET | ORAL | 0 refills | Status: DC | PRN

## 2020-03-16 ENCOUNTER — Other Ambulatory Visit: Payer: Self-pay | Admitting: Hematology and Oncology

## 2020-03-16 DIAGNOSIS — C50111 Malignant neoplasm of central portion of right female breast: Secondary | ICD-10-CM

## 2020-03-16 DIAGNOSIS — Z170 Estrogen receptor positive status [ER+]: Secondary | ICD-10-CM

## 2020-03-16 DIAGNOSIS — C7951 Secondary malignant neoplasm of bone: Secondary | ICD-10-CM

## 2020-03-16 MED ORDER — MORPHINE SULFATE (CONCENTRATE) 20 MG/ML PO SOLN: 20.0000 mg | ORAL | 0 refills | Status: AC | PRN

## 2020-03-16 MED ORDER — LORAZEPAM 1 MG PO TABS: 1.0000 mg | ORAL_TABLET | ORAL | 0 refills | Status: AC | PRN

## 2020-04-10 DEATH — deceased

## 2020-12-28 IMAGING — RF DG C-ARM 1-60 MIN
1 series · 8 of 8 positions shown · non-contrast
Comparison: 06/02/2019

CLINICAL DATA: ORIF fracture dislocation

EXAM:
DG C-ARM 1-60 MIN; RIGHT ANKLE - COMPLETE 3+ VIEW
FLUOROSCOPY TIME:  Fluoroscopy Time:  51 seconds
Radiation Exposure Index (if provided by the fluoroscopic device):
N/a
Number of Acquired Spot Images: 8

[Series 1: run · 8 of 8 slices shown]
[im 1/8]
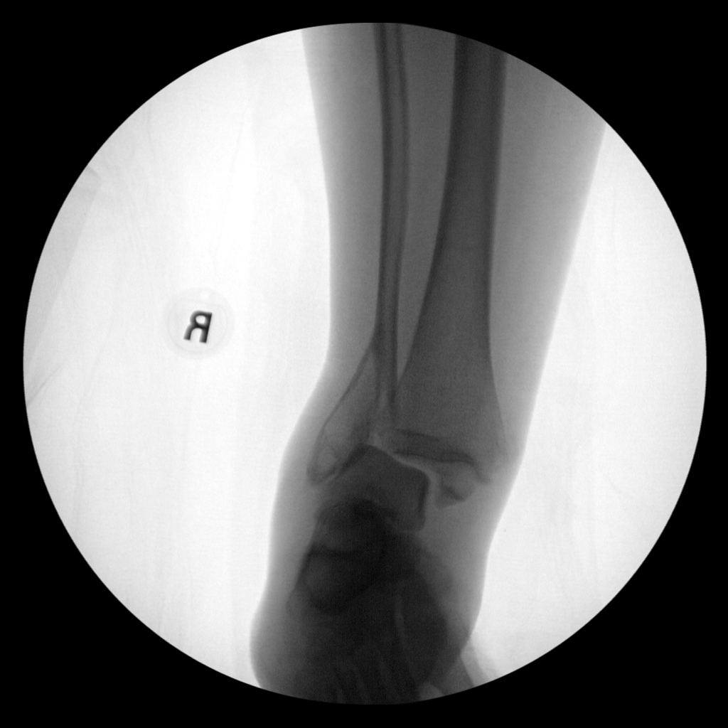
[im 2/8]
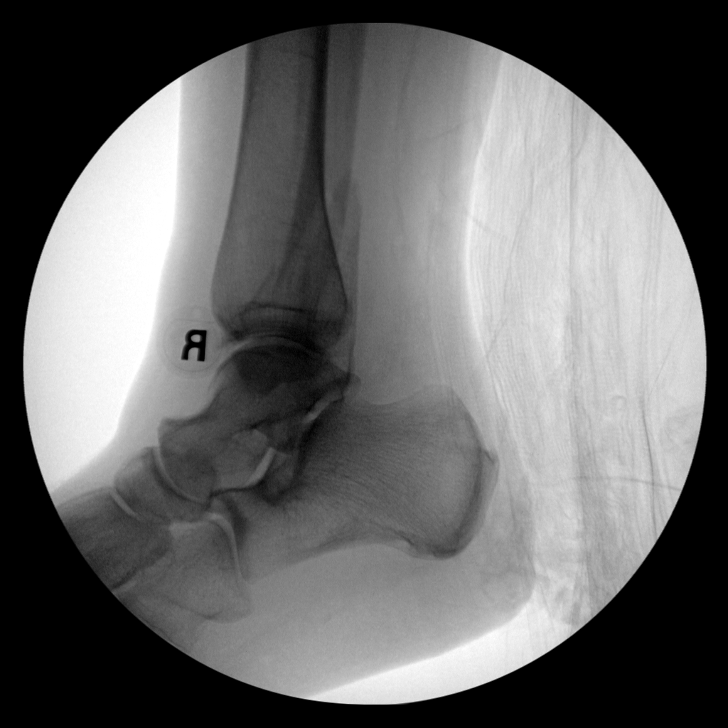
[im 3/8]
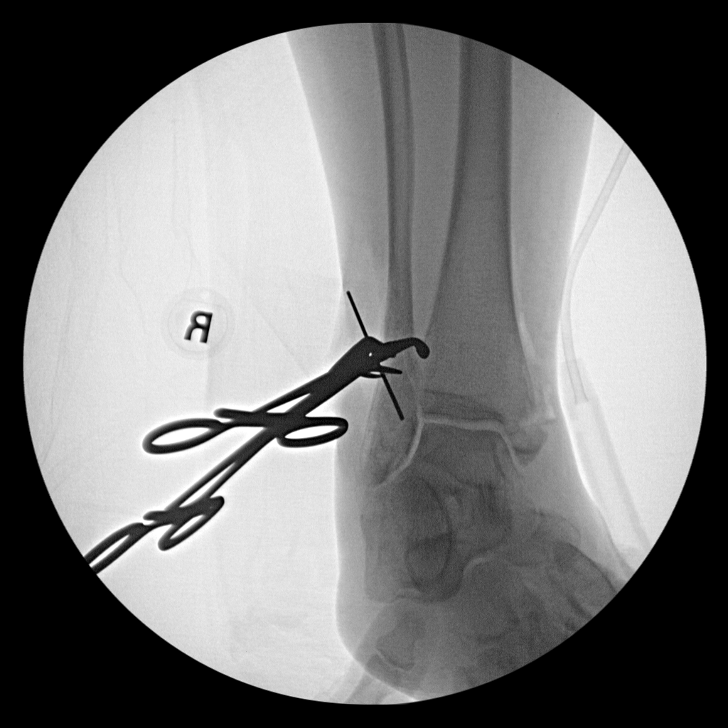
[im 4/8]
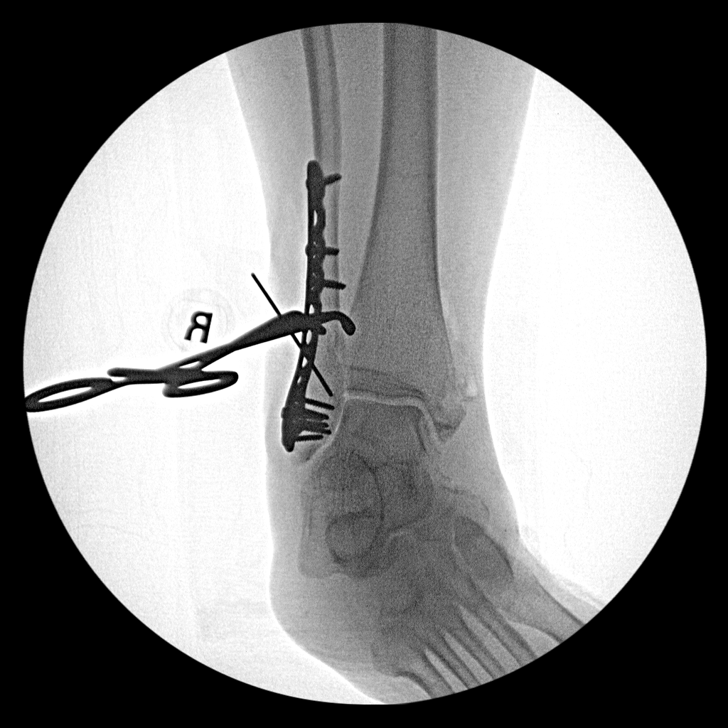
[im 5/8]
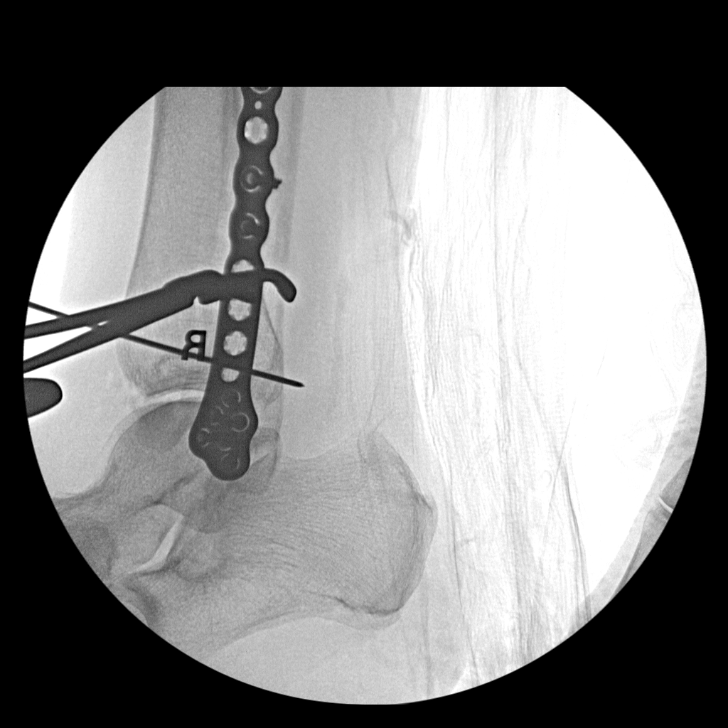
[im 6/8]
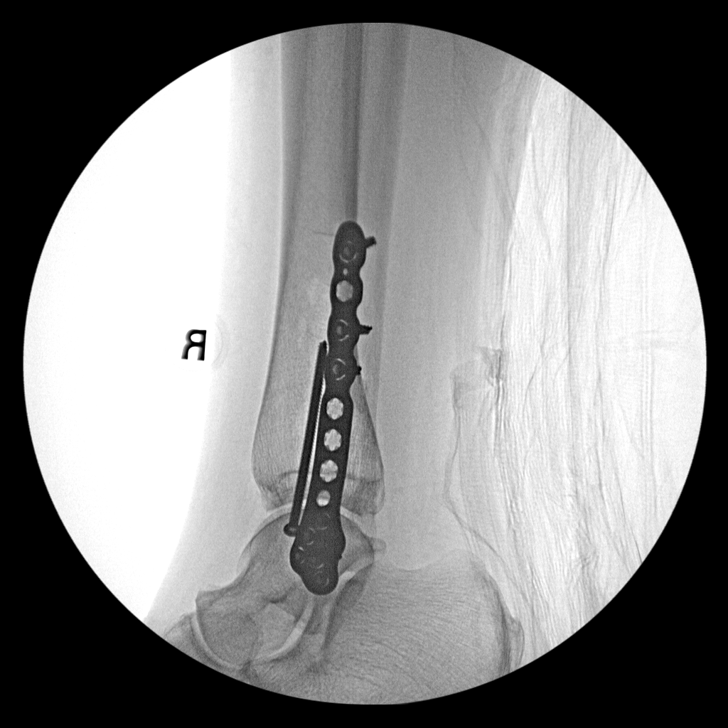
[im 7/8]
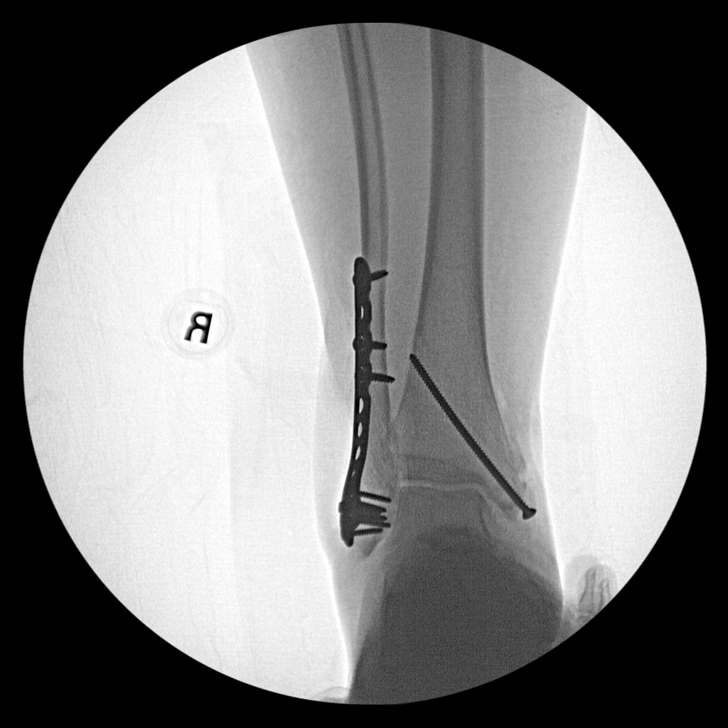
[im 8/8]
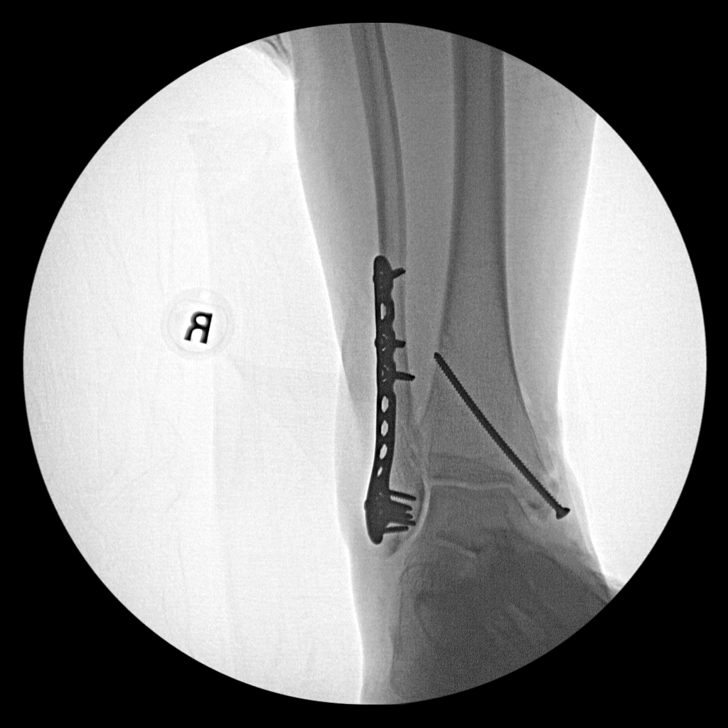

[8 of 8 positions shown; findings below may reference images not displayed]

FINDINGS: Eight fluoroscopic images are obtained during the performance of the
procedure and are provided for interpretation only. Images
demonstrate reduction of the lateral tibiotalar dislocation. Plate
and screw fixation is seen across the fibular fracture with near
anatomic alignment. Cancellous screw traverses the medial malleolar
fracture. There is near anatomic alignment of the ankle mortise.
IMPRESSION: 1. ORIF of the right ankle as above.  Near anatomic alignment.

## 2020-12-28 IMAGING — DX DG ANKLE COMPLETE 3+V*R*
3 series · 3 of 3 positions shown · non-contrast
Comparison: Earlier films, same date.

CLINICAL DATA: Reduction of ankle dislocation.

EXAM:
RIGHT ANKLE - COMPLETE 3+ VIEW

[ankle ap]
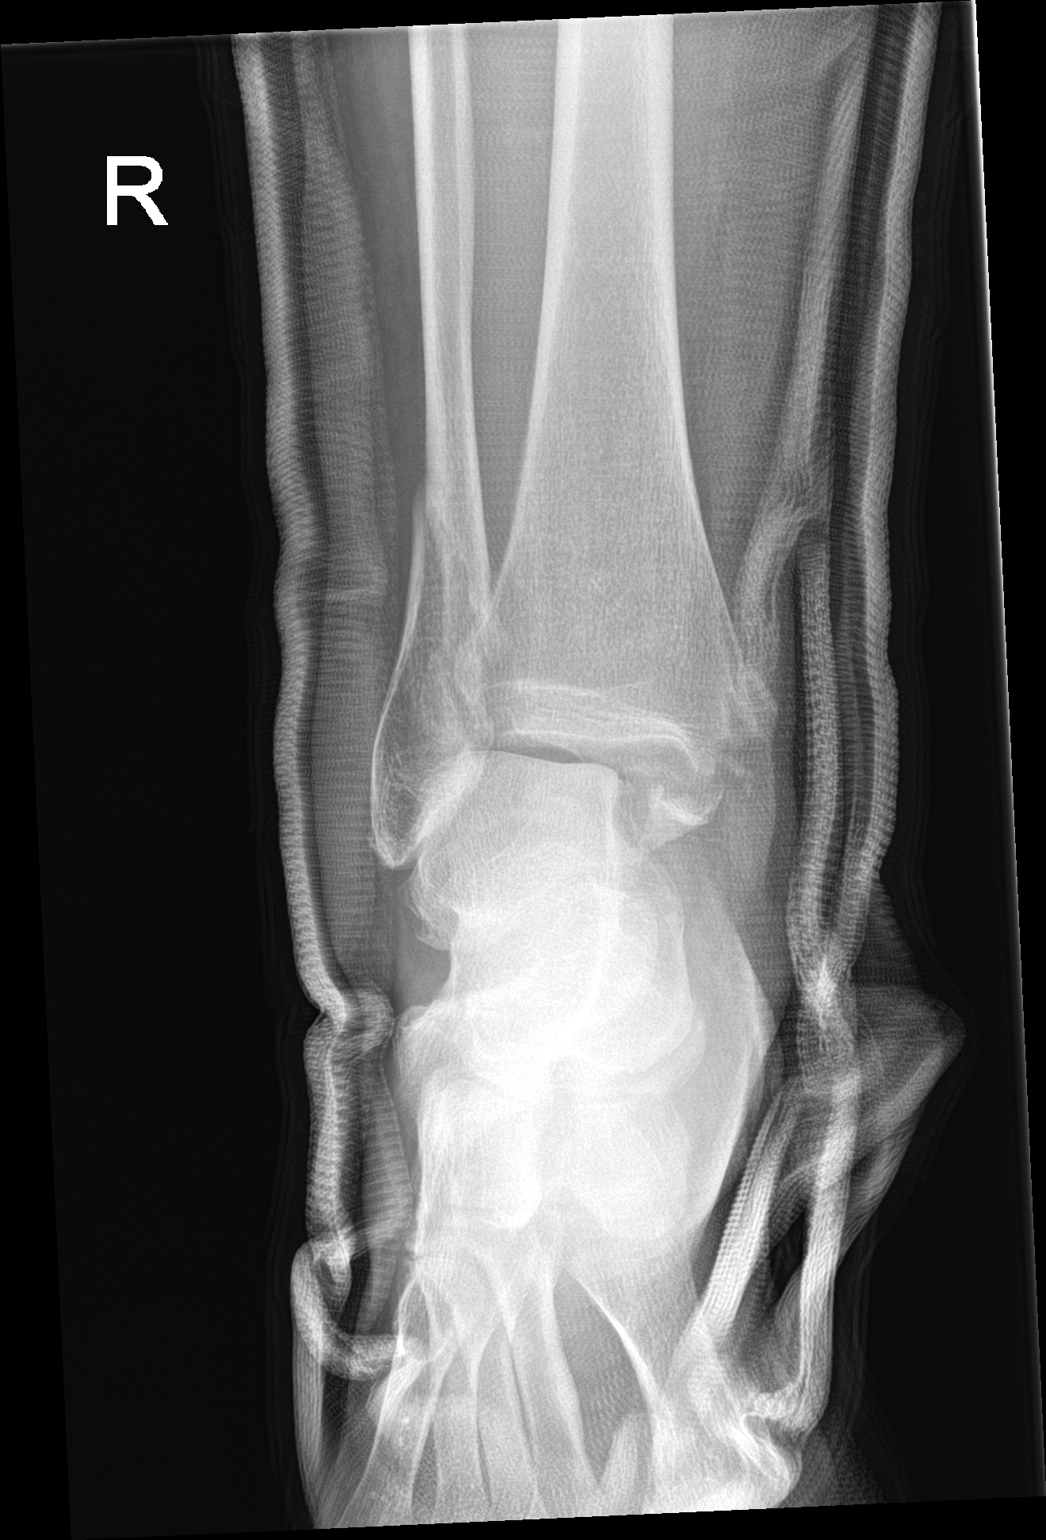

[ankle lat]
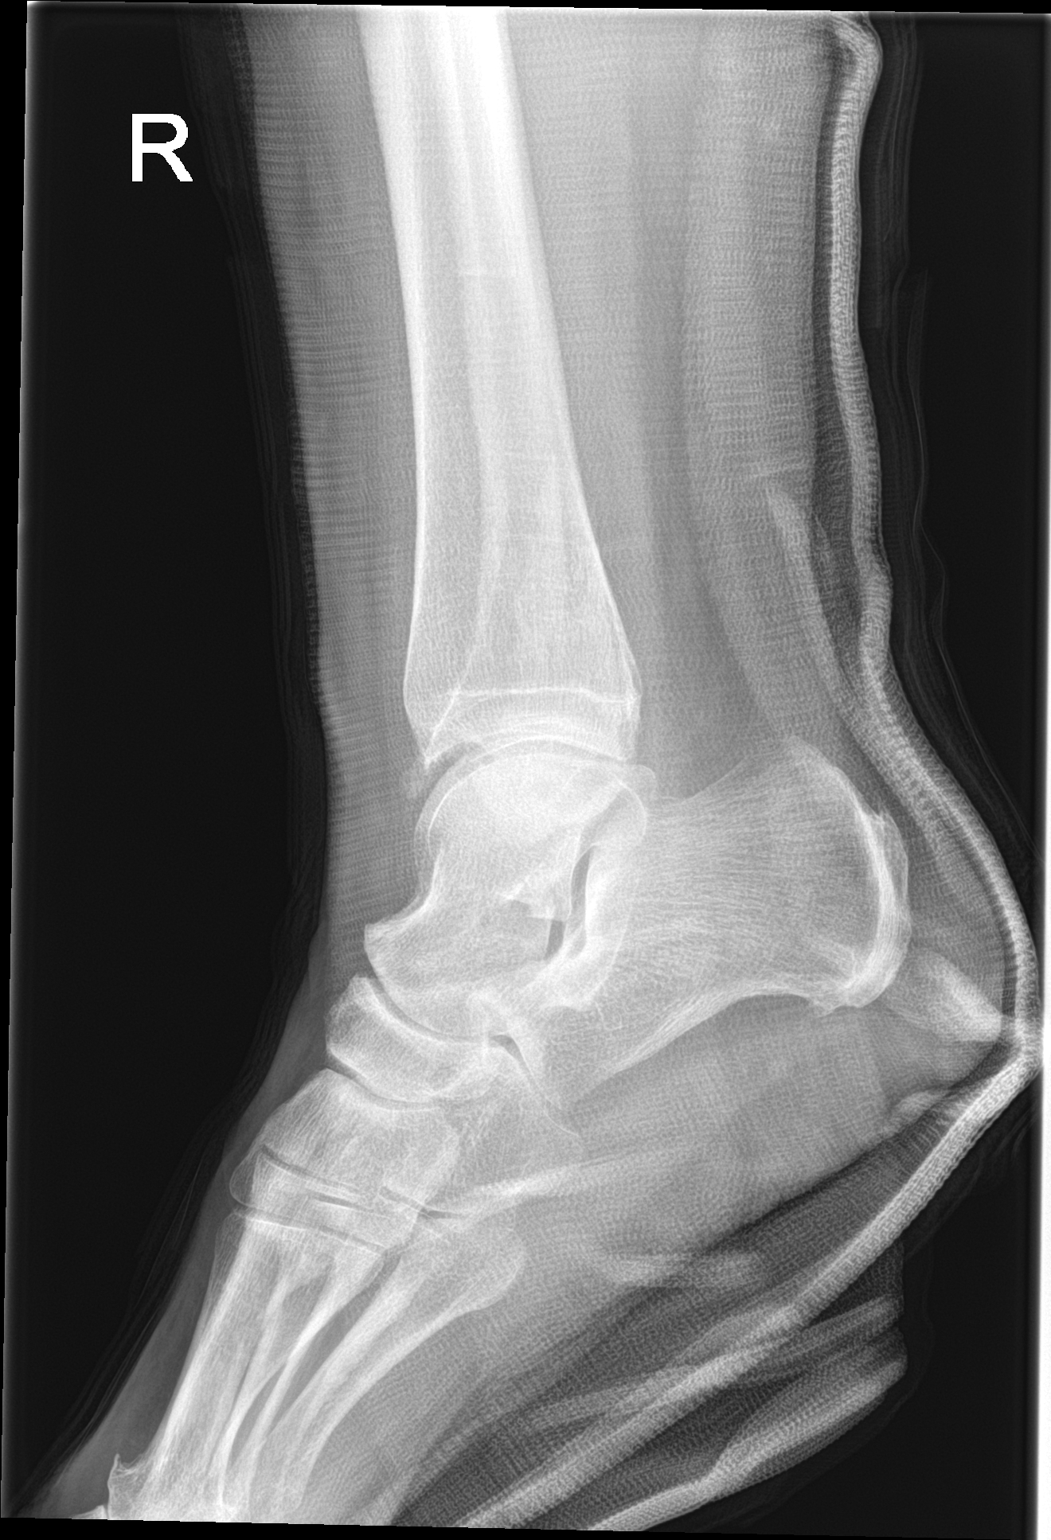

[ankle obl]
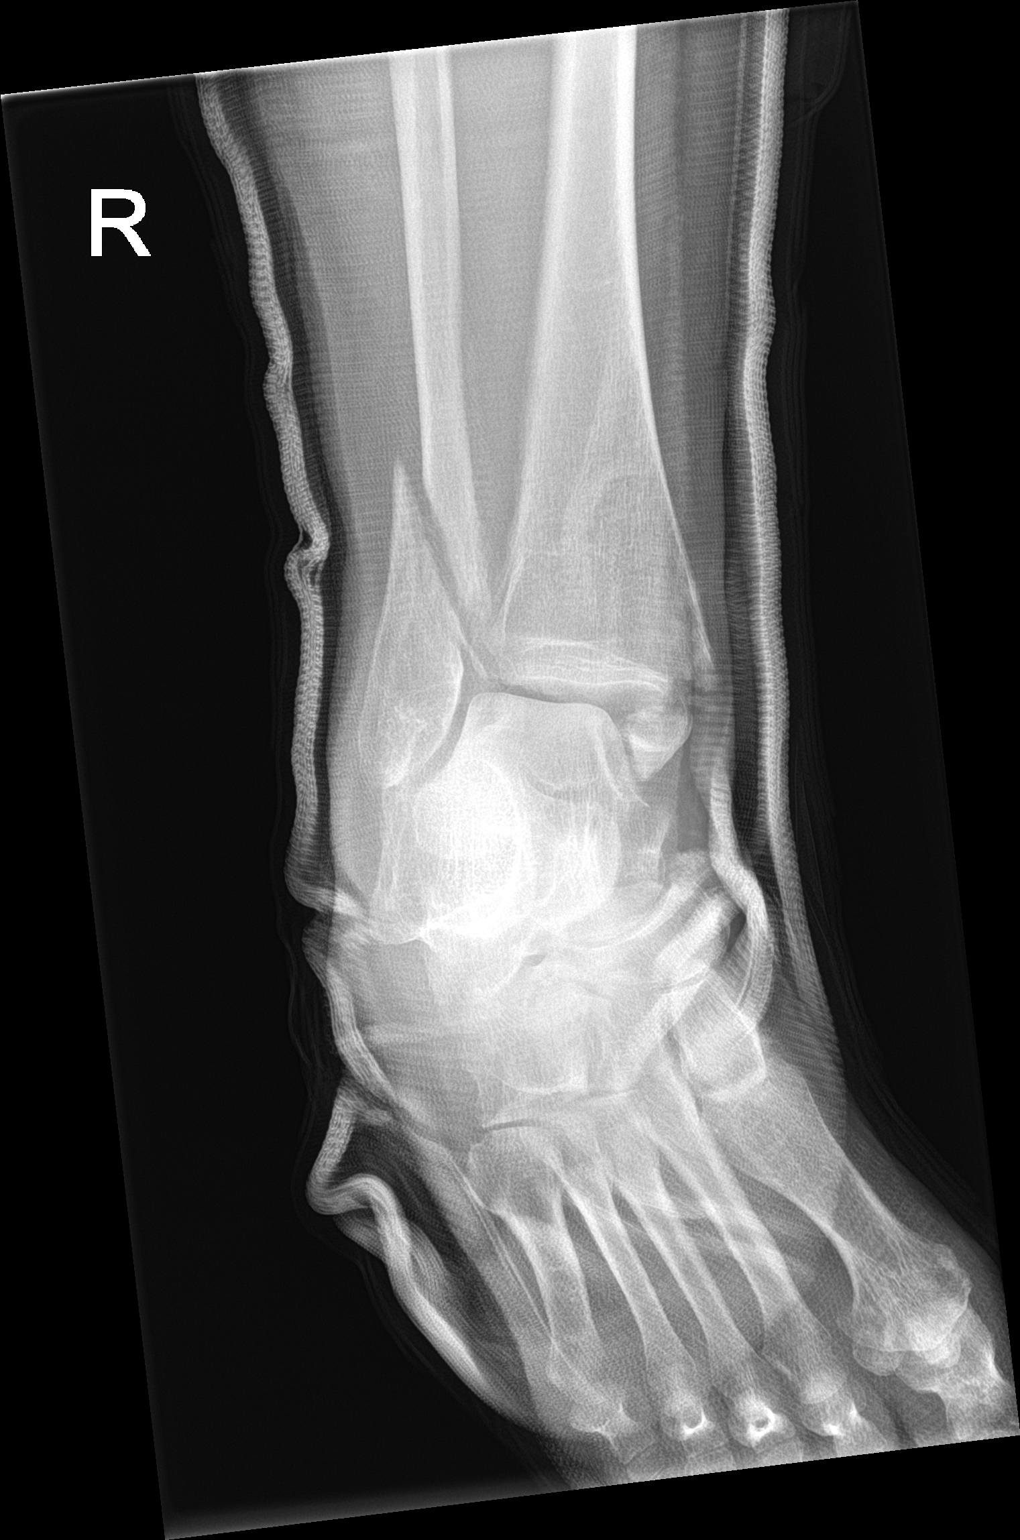

[3 of 3 positions shown; findings below may reference images not displayed]

FINDINGS: Interval reduction of the tibiotalar dislocation. Mild persistent
lateral subluxation of the talus in relation to the tibia. Much
improved position and alignment of the oblique coursing distal
fibular shaft fracture.
IMPRESSION: 1. Interval reduction of tibiotalar dislocation with mild persistent
lateral subluxation of the talus in relation to the tibia.
2. Much improved position and alignment of the distal fibular shaft
fracture.
